# Patient Record
Sex: Female | Born: 1955 | Race: White | Hispanic: No | Marital: Married | Smoking: Never smoker
Health system: Southern US, Community
[De-identification: ages and names within clinical notes are randomized; demographics above are authoritative.]

## PROBLEM LIST (undated history)

## (undated) DIAGNOSIS — Z923 Personal history of irradiation: Secondary | ICD-10-CM

## (undated) DIAGNOSIS — M1990 Unspecified osteoarthritis, unspecified site: Secondary | ICD-10-CM

## (undated) DIAGNOSIS — T8859XA Other complications of anesthesia, initial encounter: Secondary | ICD-10-CM

## (undated) DIAGNOSIS — C801 Malignant (primary) neoplasm, unspecified: Secondary | ICD-10-CM

## (undated) DIAGNOSIS — F32A Depression, unspecified: Secondary | ICD-10-CM

## (undated) DIAGNOSIS — F419 Anxiety disorder, unspecified: Secondary | ICD-10-CM

## (undated) HISTORY — PX: BREAST LUMPECTOMY: SHX2

## (undated) HISTORY — PX: OTHER SURGICAL HISTORY: SHX169

## (undated) HISTORY — PX: ABDOMINAL HYSTERECTOMY: SHX81

---

## 1998-08-10 ENCOUNTER — Ambulatory Visit (HOSPITAL_COMMUNITY): Admission: RE | Admit: 1998-08-10 | Discharge: 1998-08-10 | Payer: Self-pay

## 1999-12-07 ENCOUNTER — Ambulatory Visit (HOSPITAL_COMMUNITY): Admission: RE | Admit: 1999-12-07 | Discharge: 1999-12-07 | Payer: Self-pay

## 2001-01-23 ENCOUNTER — Ambulatory Visit (HOSPITAL_COMMUNITY): Admission: RE | Admit: 2001-01-23 | Discharge: 2001-01-23 | Payer: Self-pay

## 2002-05-07 ENCOUNTER — Ambulatory Visit (HOSPITAL_COMMUNITY): Admission: RE | Admit: 2002-05-07 | Discharge: 2002-05-07 | Payer: Self-pay | Admitting: Obstetrics and Gynecology

## 2002-05-07 ENCOUNTER — Encounter: Payer: Self-pay | Admitting: Obstetrics and Gynecology

## 2002-05-11 ENCOUNTER — Encounter: Payer: Self-pay | Admitting: Obstetrics and Gynecology

## 2002-05-11 ENCOUNTER — Encounter: Admission: RE | Admit: 2002-05-11 | Discharge: 2002-05-11 | Payer: Self-pay | Admitting: Obstetrics and Gynecology

## 2003-06-22 ENCOUNTER — Encounter: Payer: Self-pay | Admitting: Obstetrics and Gynecology

## 2003-06-22 ENCOUNTER — Ambulatory Visit (HOSPITAL_COMMUNITY): Admission: RE | Admit: 2003-06-22 | Discharge: 2003-06-22 | Payer: Self-pay | Admitting: Obstetrics and Gynecology

## 2006-04-16 ENCOUNTER — Encounter: Payer: Self-pay | Admitting: Internal Medicine

## 2007-12-24 ENCOUNTER — Ambulatory Visit: Payer: Self-pay | Admitting: Gynecology

## 2011-10-09 ENCOUNTER — Encounter (HOSPITAL_BASED_OUTPATIENT_CLINIC_OR_DEPARTMENT_OTHER)
Admission: RE | Admit: 2011-10-09 | Discharge: 2011-10-09 | Disposition: A | Discharge status: Home / Self Care | Payer: BC Managed Care – PPO | Source: Ambulatory Visit | Attending: Orthopedic Surgery | Admitting: Orthopedic Surgery

## 2011-10-10 ENCOUNTER — Ambulatory Visit (HOSPITAL_BASED_OUTPATIENT_CLINIC_OR_DEPARTMENT_OTHER)
Admission: RE | Admit: 2011-10-10 | Discharge: 2011-10-10 | Disposition: A | Discharge status: Home / Self Care | Payer: BC Managed Care – PPO | Source: Ambulatory Visit | Attending: Orthopedic Surgery | Admitting: Orthopedic Surgery

## 2011-10-10 DIAGNOSIS — Z01810 Encounter for preprocedural cardiovascular examination: Secondary | ICD-10-CM | POA: Insufficient documentation

## 2011-10-10 DIAGNOSIS — M7660 Achilles tendinitis, unspecified leg: Secondary | ICD-10-CM | POA: Insufficient documentation

## 2011-10-10 DIAGNOSIS — M722 Plantar fascial fibromatosis: Secondary | ICD-10-CM | POA: Insufficient documentation

## 2011-10-10 DIAGNOSIS — Z01812 Encounter for preprocedural laboratory examination: Secondary | ICD-10-CM | POA: Insufficient documentation

## 2011-10-10 LAB — POCT HEMOGLOBIN-HEMACUE: Hemoglobin: 12.7 g/dL (ref 12.0–15.0)

## 2011-10-17 NOTE — Op Note (Signed)
Allison Gilbert, Allison Gilbert NO.:  [id number]  MEDICAL RECORD NO.:  [id number]  LOCATION:                                 FACILITY:  PHYSICIAN:  Loreta Ave, M.D. DATE OF BIRTH:  11-06-1956  DATE OF PROCEDURE:  10/10/2011 DATE OF DISCHARGE:                              OPERATIVE REPORT   PREOPERATIVE DIAGNOSES: 1. Right heel marked tendinosis tendinopathy, interstitial tearing,     Achilles tendon at os calcis attachment.  Pre Achilles bursitis and     Haglund deformity. 2. Plantar fasciitis, left heel.  POSTOPERATIVE DIAGNOSES: 1. Right heel marked tendinosis tendinopathy, interstitial tearing,     Achilles tendon at os calcis attachment.  Pre Achilles bursitis and     Haglund deformity. 2. Plantar fasciitis, left heel.  PROCEDURES:  Right heel: 1. Exploration and debridement and then primary repair, reattachment     of the Achilles tendon to the os calcis with suture, speed bridge     technique.  Two corkscrew anchors, two FiberWire sutures, two push     lock anchors. 2. Removal of Haglund deformity and pre Achilles bursitis.  Left heel: 1. Injection of plantar fascia attachment with Depo-Medrol and     Marcaine.  SURGEON:  Loreta Ave, MD.  ASSISTANT:  Genene Churn. Barry Dienes, [place], present throughout the entire case and necessary for timely completion of procedure.  ANESTHESIA:  General.  BLOOD LOSS:  Minimal.  SPECIMENS:  None.  CULTURES:  None.  COMPLICATION:  None.  DRESSINGS:  Sterile compressive with a splint on the right.  TOURNIQUET TIME:  On the right, 1 hour.  PROCEDURE IN DETAIL:  The patient was brought to the operating room and placed in the operating room table in supine position.  After adequate anesthesia had been obtained, the patient was turned in prone position. Appropriate padding and support, and then tourniquet applied on the right thigh.  Attention was first turned on the left.  Plantar fascia attachment to the os  calcis was injected through a plantar approach with Depo-Medrol and Marcaine under sterile technique.  Attention was turned on the right.  Prepped and draped in usual sterile fashion. Exsanguinated with elevation of Esmarch.  Tourniquet inflated to 300 mmHg.  A longitudinal incision along the lateral border of the Achilles down on the lateral side of the os calcis.  Skin and subcutaneous tissues divided.  Marked thickening inflammatory debris superficially Achilles debrided.  Achilles opened longitudinally and the entire distal attachment had calcification thickening, tendinosis, tendinopathy, partial tearing throughout.  This was split longitudinally from the distal and proximal and then taken down about 90% off the os calcis attachment in order to debride all abnormal tissue.  Through that interval, pre Achilles bursa was drained and excised.  Haglund deformity was contoured with osteotomes and rongeurs.  Fluoroscopic guidance views to confirm adequate contouring.  Wound irrigated.  I then put in 2 anchors with FiberWire suture at the proximal attachment to the os calcis.  Sutures from them were then used 2 weave up and down the Achilles to firmly close the longitudinal opening, capture the tendon, and bring it down distally.  Sutured in place.  Those  2 were then brought out through the tendon, crossed over each other, and anchored distally through two push lock anchors.  Location of the anchors confirmed with fluoroscopy as well as visually.  At completion, nice firm reattachment.  I could complete the extender knee as well as dorsiflex her ankle without too much tension on the repair.  Wound irrigated.  Closed with Vicryl and staples.  Sterile compressive dressing applied.  Short-leg splint applied.  Tourniquet deflated and removed.  Anesthesia reversed.  Brought to recovery room.  Tolerated the surgery well.  No complications.     Loreta Ave, M.D.     DFM/MEDQ  D:   10/10/2011  T:  10/10/2011  Job:  [id number]  Electronically Signed by Mckinley Jewel M.D. on 10/17/2011 11:28:54 AM

## 2014-08-11 ENCOUNTER — Other Ambulatory Visit: Payer: Self-pay | Admitting: Orthopedic Surgery

## 2014-08-11 DIAGNOSIS — M542 Cervicalgia: Secondary | ICD-10-CM

## 2014-08-14 ENCOUNTER — Ambulatory Visit
Admission: RE | Admit: 2014-08-14 | Discharge: 2014-08-14 | Disposition: A | Discharge status: Home / Self Care | Payer: BC Managed Care – PPO | Source: Ambulatory Visit | Attending: Orthopedic Surgery | Admitting: Orthopedic Surgery

## 2014-08-14 DIAGNOSIS — M542 Cervicalgia: Secondary | ICD-10-CM

## 2014-09-25 DIAGNOSIS — F4323 Adjustment disorder with mixed anxiety and depressed mood: Secondary | ICD-10-CM | POA: Insufficient documentation

## 2014-09-25 DIAGNOSIS — G4700 Insomnia, unspecified: Secondary | ICD-10-CM | POA: Insufficient documentation

## 2015-10-23 ENCOUNTER — Other Ambulatory Visit: Payer: Self-pay | Admitting: Orthopedic Surgery

## 2015-10-23 DIAGNOSIS — M4722 Other spondylosis with radiculopathy, cervical region: Secondary | ICD-10-CM

## 2015-11-05 ENCOUNTER — Ambulatory Visit
Admission: RE | Admit: 2015-11-05 | Discharge: 2015-11-05 | Disposition: A | Discharge status: Home / Self Care | Payer: BLUE CROSS/BLUE SHIELD | Source: Ambulatory Visit | Attending: Orthopedic Surgery | Admitting: Orthopedic Surgery

## 2015-11-05 DIAGNOSIS — M4722 Other spondylosis with radiculopathy, cervical region: Secondary | ICD-10-CM

## 2016-11-04 ENCOUNTER — Other Ambulatory Visit: Payer: Self-pay | Admitting: Orthopedic Surgery

## 2016-11-04 DIAGNOSIS — M4316 Spondylolisthesis, lumbar region: Secondary | ICD-10-CM

## 2016-11-19 ENCOUNTER — Other Ambulatory Visit: Payer: BLUE CROSS/BLUE SHIELD

## 2016-12-07 ENCOUNTER — Ambulatory Visit
Admission: RE | Admit: 2016-12-07 | Discharge: 2016-12-07 | Disposition: A | Discharge status: Home / Self Care | Payer: BLUE CROSS/BLUE SHIELD | Source: Ambulatory Visit | Attending: Orthopedic Surgery | Admitting: Orthopedic Surgery

## 2016-12-07 DIAGNOSIS — M4316 Spondylolisthesis, lumbar region: Secondary | ICD-10-CM

## 2017-06-28 ENCOUNTER — Emergency Department (HOSPITAL_BASED_OUTPATIENT_CLINIC_OR_DEPARTMENT_OTHER)
Admission: EM | Admit: 2017-06-28 | Discharge: 2017-06-28 | Disposition: A | Discharge status: Home / Self Care | Payer: BLUE CROSS/BLUE SHIELD | Attending: Emergency Medicine | Admitting: Emergency Medicine

## 2017-06-28 ENCOUNTER — Encounter (HOSPITAL_BASED_OUTPATIENT_CLINIC_OR_DEPARTMENT_OTHER): Payer: Self-pay | Admitting: Emergency Medicine

## 2017-06-28 DIAGNOSIS — Z79899 Other long term (current) drug therapy: Secondary | ICD-10-CM | POA: Insufficient documentation

## 2017-06-28 DIAGNOSIS — R531 Weakness: Secondary | ICD-10-CM | POA: Diagnosis not present

## 2017-06-28 LAB — BASIC METABOLIC PANEL
Anion gap: 9 (ref 5–15)
BUN: 16 mg/dL (ref 6–20)
CO2: 28 mmol/L (ref 22–32)
CREATININE: 0.65 mg/dL (ref 0.44–1.00)
Calcium: 9.1 mg/dL (ref 8.9–10.3)
Chloride: 102 mmol/L (ref 101–111)
GFR calc Af Amer: >60 mL/min (ref >60)
GFR calc non Af Amer: >60 mL/min (ref >60)
GLUCOSE: 99 mg/dL (ref 65–99)
Potassium: 3.7 mmol/L (ref 3.5–5.1)
SODIUM: 139 mmol/L (ref 135–145)

## 2017-06-28 LAB — CBC WITH DIFFERENTIAL/PLATELET
Basophils Absolute: 0 10*3/uL (ref 0.0–0.1)
Basophils Relative: 1 %
EOS ABS: 0.2 10*3/uL (ref 0.0–0.7)
EOS PCT: 4 %
HCT: 38.4 % (ref 36.0–46.0)
Hemoglobin: 12.8 g/dL (ref 12.0–15.0)
LYMPHS ABS: 2.1 10*3/uL (ref 0.7–4.0)
LYMPHS PCT: 38 %
MCH: 32.7 pg (ref 26.0–34.0)
MCHC: 33.3 g/dL (ref 30.0–36.0)
MCV: 98 fL (ref 78.0–100.0)
Monocytes Absolute: 0.4 10*3/uL (ref 0.1–1.0)
Monocytes Relative: 7 %
Neutro Abs: 2.9 10*3/uL (ref 1.7–7.7)
Neutrophils Relative %: 50 %
Platelets: 166 10*3/uL (ref 150–400)
RBC: 3.92 MIL/uL (ref 3.87–5.11)
RDW: 12.9 % (ref 11.5–15.5)
WBC: 5.7 10*3/uL (ref 4.0–10.5)

## 2017-06-28 LAB — URINALYSIS, ROUTINE W REFLEX MICROSCOPIC
BILIRUBIN URINE: NEGATIVE
GLUCOSE, UA: NEGATIVE mg/dL
Ketones, ur: NEGATIVE mg/dL
Leukocytes, UA: NEGATIVE
Nitrite: NEGATIVE
PROTEIN: NEGATIVE mg/dL
Specific Gravity, Urine: 1.005 (ref 1.005–1.030)
pH: 7 (ref 5.0–8.0)

## 2017-06-28 LAB — URINALYSIS, MICROSCOPIC (REFLEX)

## 2017-06-28 LAB — TROPONIN I: Troponin I: <0.03 ng/mL (ref <0.03)

## 2017-06-28 MED ORDER — SODIUM CHLORIDE 0.9 % IV BOLUS (SEPSIS)
1000.0000 mL | Freq: Once | INTRAVENOUS | Status: AC
  Administered 2017-06-28: 1000 mL via INTRAVENOUS

## 2017-06-28 MED ORDER — LORAZEPAM 1 MG PO TABS: 1.0000 mg | ORAL_TABLET | Freq: Four times a day (QID) | ORAL | 0 refills | Status: DC | PRN

## 2017-06-28 NOTE — ED Notes (Signed)
ED Provider at bedside. 

## 2017-06-28 NOTE — Discharge Instructions (Signed)
Drink plenty of fluids and get plenty of rest.  Follow-up with your primary Dr. if you're not improving in the next 3-4 days, and return to the ER if symptoms significantly worsen or change.

## 2017-06-28 NOTE — ED Triage Notes (Signed)
Pt presents to ED with complaints of weakness, tightness in her chest,  Nausea, shortness of breath and states she feels weird a weird sensation behind her left eye and the back of her head and tightness in her throat. Marland Kitchen

## 2017-06-28 NOTE — ED Provider Notes (Signed)
[facility] Provider Note   CSN: [id number] Arrival date & time: 06/28/17  1932     History   Chief Complaint Chief Complaint  Patient presents with  . Weakness    HPI Allison Gilbert is a 61 y.o. female.  Patient is a 61 year old female with past medical history of neck surgery presenting with complaints of weakness, headache, chest discomfort, and generalized malaise. This is been ongoing for the past 2 days, then became worse this evening. She denies any difficulty breathing. She denies any fevers, chills, or productive cough. She does report increased stress at work.   The history is provided by the patient.  Weakness  Primary symptoms include no focal weakness. This is a new problem. The current episode started 2 days ago. The problem has been gradually worsening. There has been no fever. Associated symptoms include chest pain. Pertinent negatives include no shortness of breath.    History reviewed. No pertinent past medical history.  There are no active problems to display for this patient.   Past Surgical History:  Procedure Laterality Date  . ABDOMINAL HYSTERECTOMY    . cage and plates in neck    . plate in heel      OB History    No data available       Home Medications    Prior to Admission medications   Medication Sig Start Date End Date Taking? Authorizing Provider  gabapentin (NEURONTIN) 300 MG capsule Take 300 mg by mouth as needed.   Yes [provider]    Family History No family history on file.  Social History Social History  Substance Use Topics  . Smoking status: Not on file  . Smokeless tobacco: Not on file  . Alcohol use Not on file     Allergies   Patient has no known allergies.   Review of Systems Review of Systems  Respiratory: Negative for shortness of breath.   Cardiovascular: Positive for chest pain.  Neurological: Positive for weakness. Negative for focal weakness.  All other systems reviewed  and are negative.    Physical Exam Updated Vital Signs BP 119/70   Pulse 81   Temp 97.9 F (36.6 C) (Oral)   Resp 13   Ht 5' 6.75" (1.695 m)   Wt 65.8 kg (145 lb)   SpO2 100%   BMI 22.88 kg/m   Physical Exam  Constitutional: She is oriented to person, place, and time. She appears well-developed and well-nourished. No distress.  HENT:  Head: Normocephalic and atraumatic.  Eyes: Pupils are equal, round, and reactive to light. EOM are normal.  Neck: Normal range of motion. Neck supple.  Cardiovascular: Normal rate and regular rhythm.  Exam reveals no gallop and no friction rub.   No murmur heard. Pulmonary/Chest: Effort normal and breath sounds normal. No respiratory distress. She has no wheezes.  Abdominal: Soft. Bowel sounds are normal. She exhibits no distension. There is no tenderness.  Musculoskeletal: Normal range of motion. She exhibits no edema.  Neurological: She is alert and oriented to person, place, and time. No cranial nerve deficit. She exhibits normal muscle tone. Coordination normal.  Skin: Skin is warm and dry. She is not diaphoretic.  Nursing note and vitals reviewed.    ED Treatments / Results  Labs (all labs ordered are listed, but only abnormal results are displayed) Labs Reviewed  URINALYSIS, ROUTINE W REFLEX MICROSCOPIC  BASIC METABOLIC PANEL  CBC WITH DIFFERENTIAL/PLATELET  TROPONIN I    EKG  EKG  Interpretation  Date/Time:  Saturday June 28 2017 19:42:13 EDT Ventricular Rate:  88 PR Interval:    QRS Duration: 107 QT Interval:  385 QTC Calculation: 466 R Axis:   68 Text Interpretation:  Sinus rhythm Borderline T abnormalities, anterior leads Confirmed by Veryl Speak [phone]) on 06/28/2017 9:07:06 PM       Radiology No results found.  Procedures Procedures (including critical care time)  Medications Ordered in ED Medications  sodium chloride 0.9 % bolus 1,000 mL (not administered)     Initial Impression / Assessment and Plan /  ED Course  I have reviewed the triage vital signs and the nursing notes.  Pertinent labs & imaging results that were available during my care of the patient were reviewed by me and considered in my medical decision making (see chart for details).  Patient's workup reveals no laboratory abnormality. EKG is unremarkable. She is not tachycardic, there is no hypoxia, and she otherwise appears well. She does report increased stressors with work. I found nothing here today that appears emergent. I see no indication for admission or other workup at this time. She will be discharged, to return as needed for any problems.  Final Clinical Impressions(s) / ED Diagnoses   Final diagnoses:  None    New Prescriptions New Prescriptions   No medications on file     Veryl Speak, MD 06/28/17 2235

## 2017-06-28 NOTE — ED Notes (Signed)
Patient stated that she feel like fullness to the back of her head and to the back of her left eye.  She also felt weakness to both of her arms but more so on her left arm and tightness to her chest, right side of her chest.

## 2017-10-23 DIAGNOSIS — K219 Gastro-esophageal reflux disease without esophagitis: Secondary | ICD-10-CM | POA: Insufficient documentation

## 2018-11-18 DIAGNOSIS — Z90710 Acquired absence of both cervix and uterus: Secondary | ICD-10-CM | POA: Insufficient documentation

## 2018-11-18 DIAGNOSIS — Z9889 Other specified postprocedural states: Secondary | ICD-10-CM | POA: Insufficient documentation

## 2018-11-18 DIAGNOSIS — Z9079 Acquired absence of other genital organ(s): Secondary | ICD-10-CM | POA: Insufficient documentation

## 2019-05-03 ENCOUNTER — Ambulatory Visit (INDEPENDENT_AMBULATORY_CARE_PROVIDER_SITE_OTHER): Payer: Self-pay | Admitting: Licensed Clinical Social Worker

## 2019-05-03 ENCOUNTER — Encounter (HOSPITAL_COMMUNITY): Payer: Self-pay | Admitting: Licensed Clinical Social Worker

## 2019-05-03 ENCOUNTER — Other Ambulatory Visit: Payer: Self-pay

## 2019-05-03 DIAGNOSIS — F4323 Adjustment disorder with mixed anxiety and depressed mood: Secondary | ICD-10-CM

## 2019-05-03 NOTE — Progress Notes (Signed)
  Mindi Curling, LCSW

## 2019-05-03 NOTE — Progress Notes (Signed)
Virtual Visit via Video Note  I connected with Allison Gilbert on 05/03/19 at  3:30 PM EDT by a video enabled telemedicine application and verified that I am speaking with the correct person using two identifiers.  I discussed the limitations of evaluation and management by telemedicine and the availability of in person appointments. The patient expressed understanding and agreed to proceed.  I discussed the assessment and treatment plan with the patient. The patient was provided an opportunity to ask questions and all were answered. The patient agreed with the plan and demonstrated an understanding of the instructions.   The patient was advised to call back or seek an in-person evaluation if the symptoms worsen or if the condition fails to improve as anticipated.  I provided 55 minutes of non-face-to-face time during this encounter.    Comprehensive Clinical Assessment (CCA) Note  05/03/2019 Allison Gilbert [id number]  Visit Diagnosis:      ICD-10-CM   1. Adjustment disorder with mixed anxiety and depressed mood F43.23       CCA Part One  Part One has been completed on paper by the patient.  (See scanned document in Chart Review)  CCA Part Two A  Intake/Chief Complaint:  CCA Intake With Chief Complaint CCA Part Two Date: 05/03/19 CCA Part Two Time: 1530 Chief Complaint/Presenting Problem: problems with marriage, not getting along with husband. He may want a divorce Patients Currently Reported Symptoms/Problems: anxiety, depression, anger toward husband, frustration, fed up Collateral Involvement: son, some friends at work Individual's Strengths: intelligent, Scientist, research (physical sciences), determined, good sense of humor Individual's Preferences: watching tv, spending time with friends and son, laughing Individual's Abilities: hard working Type of Services Patient Feels Are Needed: OPT- marital counseling services  Mental Health Symptoms Depression:  Depression: Hopelessness, Irritability,  Tearfulness  Mania:  Mania: N/A  Anxiety:   Anxiety: Worrying, Irritability  Psychosis:  Psychosis: N/A  Trauma:  Trauma: N/A  Obsessions:  Obsessions: N/A  Compulsions:  Compulsions: N/A  Inattention:  Inattention: N/A  Hyperactivity/Impulsivity:  Hyperactivity/Impulsivity: N/A  Oppositional/Defiant Behaviors:  Oppositional/Defiant Behaviors: N/A  Borderline Personality:  Emotional Irregularity: N/A  Other Mood/Personality Symptoms:      Mental Status Exam Appearance and self-care  Stature:  Stature: Small  Weight:  Weight: Thin  Clothing:  Clothing: Neat/clean  Grooming:  Grooming: Well-groomed  Cosmetic use:  Cosmetic Use: Age appropriate  Posture/gait:  Posture/Gait: Tense  Motor activity:  Motor Activity: Not Remarkable  Sensorium  Attention:  Attention: Normal  Concentration:  Concentration: Normal  Orientation:  Orientation: X5  Recall/memory:  Recall/Memory: Normal  Affect and Mood  Affect:  Affect: Depressed  Mood:  Mood: Irritable  Relating  Eye contact:  Eye Contact: Normal  Facial expression:  Facial Expression: Sad  Attitude toward examiner:  Attitude Toward Examiner: Cooperative  Thought and Language  Speech flow: Speech Flow: Normal  Thought content:  Thought Content: Appropriate to mood and circumstances  Preoccupation:   NA  Hallucinations:   NA  Organization:   NA  Transport planner of Knowledge:  Fund of Knowledge: Average  Intelligence:  Intelligence: Average  Abstraction:  Abstraction: Normal  Judgement:  Judgement: Normal  Reality Testing:  Reality Testing: Realistic  Insight:  Insight: Good  Decision Making:  Decision Making: Normal  Social Functioning  Social Maturity:  Social Maturity: Responsible  Social Judgement:  Social Judgement: Normal  Stress  Stressors:  Stressors: Family conflict  Coping Ability:  Coping Ability: Deficient supports, English as a second language teacher Deficits:  NA  Supports:   son, friends   Family and Psychosocial  History: Family history Marital status: Married Number of Years Married: 5 What types of issues is patient dealing with in the relationship?: husband is very jealous and controlling, gets angry often, husband is jealous of the way she cares for her autistic son Additional relationship information: had been married to son's father for many years and then after they divorced, she was single for a number of years. Husband has been married 5 times.  Are you sexually active?: No What is your sexual orientation?: heterosexual Has your sexual activity been affected by drugs, alcohol, medication, or emotional stress?: emotional stress Does patient have children?: Yes How many children?: 1 How is patient's relationship with their children?: Cristie Hem is in his 52s and has autism. He is relatively high functioning, is going to college, and lives with Jerrye Beavers and husband.   Childhood History:     CCA Part Two B  Employment/Work Situation: Employment / Work Situation Employment situation: Employed Where is patient currently employed?: [employer]  How long has patient been employed?: 3 years Patient's job has been impacted by current illness: No Did You Receive Any Psychiatric Treatment/Services While in Passenger transport manager?: No Are There Guns or Other Weapons in [place]?: Yes Types of Guns/Weapons: husband has rifle and Careers information officer?: Yes  Education: Education Did Teacher, adult education From [employer]?: Yes Did Physicist, medical?: Yes Did Heritage manager?: No Did You Have An Individualized Education Program (IIEP): No Did You Have Any Difficulty At [employer]?: No  Religion: Religion/Spirituality Are You A Religious Person?: Yes What is Your Religious Affiliation?: Baptist How Might This Affect Treatment?: it won't  Leisure/Recreation: Leisure / Recreation Leisure and Hobbies: watching tv, spending time with son, going out with  girlfriends  Exercise/Diet: Exercise/Diet Do You Exercise?: No Have You Gained or Lost A Significant Amount of Weight in the Past Six Months?: No Do You Follow a Special Diet?: No Do You Have Any Trouble Sleeping?: No  CCA Part Two C  Alcohol/Drug Use: Alcohol / Drug Use Pain Medications: see MAR Prescriptions: see MAR Over the Counter: see MAR History of alcohol / drug use?: No history of alcohol / drug abuse                      CCA Part Three  ASAM's:  Six Dimensions of Multidimensional Assessment  Dimension 1:  Acute Intoxication and/or Withdrawal Potential:     Dimension 2:  Biomedical Conditions and Complications:     Dimension 3:  Emotional, Behavioral, or Cognitive Conditions and Complications:     Dimension 4:  Readiness to Change:     Dimension 5:  Relapse, Continued use, or Continued Problem Potential:     Dimension 6:  Recovery/Living Environment:      Substance use Disorder (SUD)    Social Function:  Social Functioning Social Maturity: Responsible Social Judgement: Normal  Stress:  Stress Stressors: Family conflict Coping Ability: Deficient supports, Overwhelmed Patient Takes Medications The Way The Doctor Instructed?: Yes Priority Risk: Low Acuity  Risk Assessment- Self-Harm Potential: Risk Assessment For Self-Harm Potential Thoughts of Self-Harm: No current thoughts Method: No plan Availability of Means: No access/NA  Risk Assessment -Dangerous to Others Potential: Risk Assessment For Dangerous to Others Potential Method: No Plan Availability of Means: No access or NA Intent: Vague intent or NA Notification Required: No need or identified person  DSM5 Diagnoses: There are no  active problems to display for this patient.   Patient Centered Plan: Patient is on the following Treatment Plan(s):  Anxiety and Depression  Recommendations for Services/Supports/Treatments: Recommendations for Services/Supports/Treatments Recommendations  For Services/Supports/Treatments: Individual Therapy  Treatment Plan Summary: OP Treatment Plan Summary: "to improve my relationship with my husband or to get out of the marriage".  Referrals to Alternative Service(s): Referred to Alternative Service(s):   Place:   Date:   Time:    Referred to Alternative Service(s):   Place:   Date:   Time:    Referred to Alternative Service(s):   Place:   Date:   Time:    Referred to Alternative Service(s):   Place:   Date:   Time:     Mindi Curling , LCSW

## 2019-05-12 ENCOUNTER — Encounter: Payer: Self-pay | Admitting: Family

## 2019-11-08 ENCOUNTER — Ambulatory Visit (INDEPENDENT_AMBULATORY_CARE_PROVIDER_SITE_OTHER): Payer: PRIVATE HEALTH INSURANCE | Admitting: Family

## 2019-11-08 ENCOUNTER — Other Ambulatory Visit: Payer: Self-pay

## 2019-11-08 ENCOUNTER — Encounter: Payer: Self-pay | Admitting: Family

## 2019-11-08 VITALS — BP 114/76 | HR 80 | Temp 98.1°F | Ht 66.75 in | Wt 152.1 lb

## 2019-11-08 DIAGNOSIS — F419 Anxiety disorder, unspecified: Secondary | ICD-10-CM | POA: Diagnosis not present

## 2019-11-08 DIAGNOSIS — G4700 Insomnia, unspecified: Secondary | ICD-10-CM | POA: Diagnosis not present

## 2019-11-08 MED ORDER — LORAZEPAM 1 MG PO TABS: 1.0000 mg | ORAL_TABLET | Freq: Every evening | ORAL | 0 refills | Status: DC | PRN

## 2019-11-08 MED ORDER — CITALOPRAM HYDROBROMIDE 20 MG PO TABS: 20.0000 mg | ORAL_TABLET | Freq: Every day | ORAL | 3 refills | Status: DC

## 2019-11-08 NOTE — Patient Instructions (Signed)
Please check on the status of your yearly labs- let me know if they need to be updated; Please get your mammogram in December as we discussed.

## 2019-11-08 NOTE — Progress Notes (Signed)
  Allison Gilbert is a 63 y.o. female with the following history as recorded in EpicCare:  There are no active problems to display for this patient.   Current Outpatient Medications  Medication Sig Dispense Refill  . Calcium Carbonate-Vitamin D (CALCIUM 500 + D) 500-125 MG-UNIT TABS Take by mouth.    . citalopram (CELEXA) 20 MG tablet Take 1 tablet (20 mg total) by mouth daily. 90 tablet 3  . estradiol (ESTRACE) 0.5 MG tablet Take by mouth.    Marland Kitchen LORazepam (ATIVAN) 1 MG tablet Take 1 tablet (1 mg total) by mouth at bedtime as needed for anxiety. 30 tablet 0   No current facility-administered medications for this visit.     Allergies: Patient has no known allergies.  No past medical history on file.  Past Surgical History:  Procedure Laterality Date  . ABDOMINAL HYSTERECTOMY    . cage and plates in neck    . plate in heel      No family history on file.  Social History   Tobacco Use  . Smoking status: Not on file  Substance Use Topics  . Alcohol use: Not on file    Subjective:  Presents today as a new patient; in baseline state of health today; Has had CPE with GYN this year- will be having mammogram in December 2020; patient planning to schedule herself; in the process of tapering dosage of Estrogen;  Colonoscopy completed in 07/2019; Would like to get flu shot today; Under care of Raliegh Ip for orthopedic needs;    Objective:  Vitals:   11/08/19 0902  BP: 114/76  Pulse: 80  Temp: 98.1 F (36.7 C)  TempSrc: Oral  SpO2: 98%  Weight: 152 lb 1.9 oz (69 kg)  Height: 5' 6.75" (1.695 m)    General: Well developed, well nourished, in no acute distress  Skin : Warm and dry.  Head: Normocephalic and atraumatic  Lungs: Respirations unlabored; clear to auscultation bilaterally without wheeze, rales, rhonchi  CVS exam: normal rate and regular rhythm.  Neurologic: Alert and oriented; speech intact; face symmetrical; moves all extremities well; CNII-XII intact without focal  deficit   Assessment:  1. Anxiety   2. Insomnia, unspecified type     Plan:  Refills updated as requested; patient will verify if she needs yearly labs- call back if labs need to be scheduled; She will schedule her mammogram for December 2020; Flu shot given;   This visit occurred during the SARS-CoV-2 public health emergency.  Safety protocols were in place, including screening questions prior to the visit, additional usage of staff PPE, and extensive cleaning of exam room while observing appropriate contact time as indicated for disinfecting solutions.     No follow-ups on file.  No orders of the defined types were placed in this encounter.   Requested Prescriptions   Signed Prescriptions Disp Refills  . citalopram (CELEXA) 20 MG tablet 90 tablet 3    Sig: Take 1 tablet (20 mg total) by mouth daily.  Marland Kitchen LORazepam (ATIVAN) 1 MG tablet 30 tablet 0    Sig: Take 1 tablet (1 mg total) by mouth at bedtime as needed for anxiety.

## 2020-05-31 DIAGNOSIS — L738 Other specified follicular disorders: Secondary | ICD-10-CM | POA: Diagnosis not present

## 2020-05-31 DIAGNOSIS — L821 Other seborrheic keratosis: Secondary | ICD-10-CM | POA: Diagnosis not present

## 2020-05-31 DIAGNOSIS — Z85828 Personal history of other malignant neoplasm of skin: Secondary | ICD-10-CM | POA: Diagnosis not present

## 2020-05-31 DIAGNOSIS — D692 Other nonthrombocytopenic purpura: Secondary | ICD-10-CM | POA: Diagnosis not present

## 2020-05-31 DIAGNOSIS — L820 Inflamed seborrheic keratosis: Secondary | ICD-10-CM | POA: Diagnosis not present

## 2020-06-12 ENCOUNTER — Ambulatory Visit (INDEPENDENT_AMBULATORY_CARE_PROVIDER_SITE_OTHER): Payer: Medicare Other | Admitting: Licensed Clinical Social Worker

## 2020-06-12 ENCOUNTER — Encounter (HOSPITAL_COMMUNITY): Payer: Self-pay | Admitting: Licensed Clinical Social Worker

## 2020-06-12 ENCOUNTER — Other Ambulatory Visit: Payer: Self-pay

## 2020-06-12 DIAGNOSIS — F4323 Adjustment disorder with mixed anxiety and depressed mood: Secondary | ICD-10-CM | POA: Diagnosis not present

## 2020-06-12 NOTE — Progress Notes (Signed)
Virtual Visit via Telephone Note  I connected with Allison Gilbert on 06/12/20 at  1:30 PM EDT by telephone and verified that I am speaking with the correct person using two identifiers.  Location: Patient: home Provider: office   I discussed the limitations, risks, security and privacy concerns of performing an evaluation and management service by telephone and the availability of in person appointments. I also discussed with the patient that there may be a patient responsible charge related to this service. The patient expressed understanding and agreed to proceed.   Type of treatment: Individual therapy   Treatment Goals addressed: "Improve my relationship with my son's father and step mother".   Interventions: CBT   Summary: Allison Gilbert is a 64 y.o. female who presents with Adjustment Disorder with mixed anxiety and depressed mood.   Suicidal/Homicidal: No - without intent/plan   Therapist Response: Allison Gilbert met with clinician for an individual session. Allison Gilbert discussed her psychiatric symptoms, her current life events and her homework. Allison Gilbert shared that things have really improved with her husband over the past year, particularly because he "knew where I was 100% of the time". Clinician discussed her feelings about the relationship, as well as current problems with her son's family. Clinician utilized CBT to process thoughts, feelings, and behaviors with her son's step mother. Clinician processed feelings of anger and anxiety surrounding those interactions. Clinician explored urge to maintain contact with these people, due to her son being 9 years old. Clinician utilized CBT Pros and Cons of stepping back and allowing son to be the main communicator with them. Clinician also identified the values she places on them, and herself, which can be challenging for Allison Gilbert to not judge them.   Plan: 4 weeks   Diagnosis: Axis I: Adjustment Disorder with mixed anxiety and depressed mood  I discussed  the assessment and treatment plan with the patient. The patient was provided an opportunity to ask questions and all were answered. The patient agreed with the plan and demonstrated an understanding of the instructions.   The patient was advised to call back or seek an in-person evaluation if the symptoms worsen or if the condition fails to improve as anticipated.  I provided 60 minutes of non-face-to-face time during this encounter.   Mindi Curling, LCSW

## 2020-07-10 ENCOUNTER — Ambulatory Visit (INDEPENDENT_AMBULATORY_CARE_PROVIDER_SITE_OTHER): Payer: Medicare Other | Admitting: Family

## 2020-07-10 ENCOUNTER — Encounter: Payer: Self-pay | Admitting: Family

## 2020-07-10 ENCOUNTER — Other Ambulatory Visit: Payer: Self-pay

## 2020-07-10 VITALS — BP 110/78 | HR 73 | Temp 97.9°F | Ht 66.5 in | Wt 151.2 lb

## 2020-07-10 DIAGNOSIS — Z0000 Encounter for general adult medical examination without abnormal findings: Secondary | ICD-10-CM | POA: Diagnosis not present

## 2020-07-10 DIAGNOSIS — E559 Vitamin D deficiency, unspecified: Secondary | ICD-10-CM

## 2020-07-10 DIAGNOSIS — Z01419 Encounter for gynecological examination (general) (routine) without abnormal findings: Secondary | ICD-10-CM

## 2020-07-10 DIAGNOSIS — Z23 Encounter for immunization: Secondary | ICD-10-CM

## 2020-07-10 DIAGNOSIS — Z13220 Encounter for screening for lipoid disorders: Secondary | ICD-10-CM

## 2020-07-10 DIAGNOSIS — Z1231 Encounter for screening mammogram for malignant neoplasm of breast: Secondary | ICD-10-CM

## 2020-07-10 DIAGNOSIS — E539 Vitamin B deficiency, unspecified: Secondary | ICD-10-CM | POA: Diagnosis not present

## 2020-07-10 LAB — COMPREHENSIVE METABOLIC PANEL
AG Ratio: 1.9 (calc) (ref 1.0–2.5)
ALT: 13 U/L (ref 6–29)
AST: 19 U/L (ref 10–35)
Albumin: 4.6 g/dL (ref 3.6–5.1)
Alkaline phosphatase (APISO): 48 U/L (ref 37–153)
BUN: 13 mg/dL (ref 7–25)
CO2: 29 mmol/L (ref 20–32)
Calcium: 9.5 mg/dL (ref 8.6–10.4)
Chloride: 103 mmol/L (ref 98–110)
Creat: 0.76 mg/dL (ref 0.50–0.99)
Globulin: 2.4 g/dL (calc) (ref 1.9–3.7)
Glucose, Bld: 86 mg/dL (ref 65–99)
Potassium: 4.2 mmol/L (ref 3.5–5.3)
Sodium: 140 mmol/L (ref 135–146)
Total Bilirubin: 1.3 mg/dL — ABNORMAL HIGH (ref 0.2–1.2)
Total Protein: 7 g/dL (ref 6.1–8.1)

## 2020-07-10 LAB — CBC WITH DIFFERENTIAL/PLATELET
Absolute Monocytes: 331 cells/uL (ref 200–950)
Basophils Absolute: 62 cells/uL (ref 0–200)
Basophils Relative: 1.3 %
Eosinophils Absolute: 163 cells/uL (ref 15–500)
Eosinophils Relative: 3.4 %
HCT: 42.1 % (ref 35.0–45.0)
Hemoglobin: 13.9 g/dL (ref 11.7–15.5)
Lymphs Abs: 1570 cells/uL (ref 850–3900)
MCH: 31.8 pg (ref 27.0–33.0)
MCHC: 33 g/dL (ref 32.0–36.0)
MCV: 96.3 fL (ref 80.0–100.0)
MPV: 10.8 fL (ref 7.5–12.5)
Monocytes Relative: 6.9 %
Neutro Abs: 2674 cells/uL (ref 1500–7800)
Neutrophils Relative %: 55.7 %
Platelets: 176 10*3/uL (ref 140–400)
RBC: 4.37 10*6/uL (ref 3.80–5.10)
RDW: 12.1 % (ref 11.0–15.0)
Total Lymphocyte: 32.7 %
WBC: 4.8 10*3/uL (ref 3.8–10.8)

## 2020-07-10 LAB — VITAMIN D 25 HYDROXY (VIT D DEFICIENCY, FRACTURES): Vit D, 25-Hydroxy: 26 ng/mL — ABNORMAL LOW (ref 30–100)

## 2020-07-10 LAB — LIPID PANEL
Cholesterol: 234 mg/dL — ABNORMAL HIGH (ref <200)
HDL: 101 mg/dL (ref >=50)
LDL Cholesterol (Calc): 117 mg/dL (calc) — ABNORMAL HIGH
Non-HDL Cholesterol (Calc): 133 mg/dL (calc) — ABNORMAL HIGH (ref <130)
Total CHOL/HDL Ratio: 2.3 (calc) (ref <5.0)
Triglycerides: 64 mg/dL (ref <150)

## 2020-07-10 LAB — TSH: TSH: 3.66 mIU/L (ref 0.40–4.50)

## 2020-07-10 MED ORDER — LORAZEPAM 1 MG PO TABS: 1.0000 mg | ORAL_TABLET | Freq: Every evening | ORAL | 0 refills | Status: DC | PRN

## 2020-07-10 MED ORDER — ESTRADIOL 0.5 MG PO TABS: 0.7500 mg | ORAL_TABLET | Freq: Every day | ORAL | 1 refills | Status: DC

## 2020-07-10 MED ORDER — CITALOPRAM HYDROBROMIDE 20 MG PO TABS: 20.0000 mg | ORAL_TABLET | Freq: Every day | ORAL | 3 refills | Status: DC

## 2020-07-10 NOTE — Progress Notes (Signed)
Allison Gilbert is a 64 y.o. female with the following history as recorded in EpicCare:  Patient Active Problem List   Diagnosis Date Noted  . H/O cervical spine surgery 11/18/2018  . S/P total hysterectomy and bilateral salpingo-oophorectomy 11/18/2018  . GERD (gastroesophageal reflux disease) 10/23/2017  . Adjustment disorder with mixed anxiety and depressed mood 09/25/2014  . Insomnia 09/25/2014    Current Outpatient Medications  Medication Sig Dispense Refill  . Calcium Carbonate-Vitamin D (CALCIUM 500 + D) 500-125 MG-UNIT TABS Take by mouth.    . citalopram (CELEXA) 20 MG tablet Take 1 tablet (20 mg total) by mouth daily. 90 tablet 3  . estradiol (ESTRACE) 0.5 MG tablet Take 1.5 tablets (0.75 mg total) by mouth daily. 135 tablet 1  . LORazepam (ATIVAN) 1 MG tablet Take 1 tablet (1 mg total) by mouth at bedtime as needed for anxiety. 30 tablet 0   No current facility-administered medications for this visit.    Allergies: Codeine  No past medical history on file.  Past Surgical History:  Procedure Laterality Date  . ABDOMINAL HYSTERECTOMY    . cage and plates in neck    . plate in heel      No family history on file.  Social History   Tobacco Use  . Smoking status: Never Smoker  . Smokeless tobacco: Never Used  Substance Use Topics  . Alcohol use: Not on file    Subjective:  Presents for yearly CPE; requesting referral to new GYN in [place]; needs updated order for screening mammogram; Agrees to Prevnar today; Up to date on dental and vision exams;  Review of Systems  Constitutional: Negative.   HENT: Negative.   Eyes: Negative.   Respiratory: Negative.   Cardiovascular: Negative.   Gastrointestinal: Negative.   Genitourinary: Negative.   Musculoskeletal: Negative.   Skin: Negative.   Neurological: Negative.   Endo/Heme/Allergies: Negative.   Psychiatric/Behavioral: Negative.      Objective:  Vitals:   07/10/20 1047  BP: 110/78  Pulse: 73  Temp:  97.9 F (36.6 C)  TempSrc: Oral  SpO2: 97%  Weight: 151 lb 3.2 oz (68.6 kg)  Height: 5' 6.5" (1.689 m)    General: Well developed, well nourished, in no acute distress  Skin : Warm and dry.  Head: Normocephalic and atraumatic  Eyes: Sclera and conjunctiva clear; pupils round and reactive to light; extraocular movements intact  Ears: External normal; canals clear; tympanic membranes normal  Oropharynx: Pink, supple. No suspicious lesions  Neck: Supple without thyromegaly, adenopathy  Lungs: Respirations unlabored; clear to auscultation bilaterally without wheeze, rales, rhonchi  CVS exam: normal rate and regular rhythm.  Abdomen: Soft; nontender; nondistended; normoactive bowel sounds; no masses or hepatosplenomegaly  Musculoskeletal: No deformities; no active joint inflammation  Extremities: No edema, cyanosis, clubbing  Vessels: Symmetric bilaterally  Neurologic: Alert and oriented; speech intact; face symmetrical; moves all extremities well; CNII-XII intact without focal deficit   Assessment:  1. PE (physical exam), annual   2. Screening mammogram, encounter for   3. Encounter for gynecological examination without abnormal finding   4. Lipid screening   5. Vitamin D deficiency     Plan:  Age appropriate preventive healthcare needs addressed; encouraged regular eye doctor and dental exams; encouraged regular exercise; will update labs and refills as needed today; follow-up to be determined; Try increasing Estrace to 0.75 mg daily- she does not want to go back to 0.1 mg due to exacerbation of fibrocystic breast issues; she will discuss further  with new GYN; She will call back with name of medication that she might like to try other than Celexa- she took in the past and did well;  Prevnar updated today;  No follow-ups on file.  Orders Placed This Encounter  Procedures  . MM Digital Screening    Standing Status:   Future    Standing Expiration Date:   07/10/2021    Order  Specific Question:   Reason for Exam (SYMPTOM  OR DIAGNOSIS REQUIRED)    Answer:   screening mammogram    Order Specific Question:   Preferred imaging location?    Answer:   [facility]  . Pneumococcal conjugate vaccine 13-valent  . CBC with Differential/Platelet    Standing Status:   Future    Number of Occurrences:   1    Standing Expiration Date:   07/10/2021  . Comp Met (CMET)    Standing Status:   Future    Number of Occurrences:   1    Standing Expiration Date:   07/10/2021  . Lipid panel    Standing Status:   Future    Number of Occurrences:   1    Standing Expiration Date:   07/10/2021  . TSH    Standing Status:   Future    Number of Occurrences:   1    Standing Expiration Date:   07/10/2021  . Vitamin D (25 hydroxy)    Standing Status:   Future    Number of Occurrences:   1    Standing Expiration Date:   07/10/2021  . Ambulatory referral to Gynecology    Referral Priority:   Routine    Referral Type:   Consultation    Referral Reason:   Specialty Services Required    Requested Specialty:   Gynecology    Number of Visits Requested:   1    Requested Prescriptions   Signed Prescriptions Disp Refills  . citalopram (CELEXA) 20 MG tablet 90 tablet 3    Sig: Take 1 tablet (20 mg total) by mouth daily.  Marland Kitchen LORazepam (ATIVAN) 1 MG tablet 30 tablet 0    Sig: Take 1 tablet (1 mg total) by mouth at bedtime as needed for anxiety.  Marland Kitchen estradiol (ESTRACE) 0.5 MG tablet 135 tablet 1    Sig: Take 1.5 tablets (0.75 mg total) by mouth daily.

## 2020-07-14 ENCOUNTER — Other Ambulatory Visit: Payer: Self-pay | Admitting: Family

## 2020-07-14 DIAGNOSIS — Z1231 Encounter for screening mammogram for malignant neoplasm of breast: Secondary | ICD-10-CM

## 2020-07-21 ENCOUNTER — Telehealth: Payer: Self-pay | Admitting: Family

## 2020-07-21 NOTE — Telephone Encounter (Signed)
Allison Gilbert is out of the office pls advise.Marland KitchenJohny Chess

## 2020-07-21 NOTE — Telephone Encounter (Signed)
Notified pt w/MD response.../lmb 

## 2020-07-21 NOTE — Telephone Encounter (Signed)
This can be a normal reaction.  She can ice the area and apply either benadryl or cortisone cream.  If the redness increases she may need to have this evaluated.

## 2020-07-21 NOTE — Telephone Encounter (Signed)
    Patient calling to report itchy, warm to touch site on arm where she received pneumonia vac on 7/26.  Please advise

## 2020-07-25 ENCOUNTER — Ambulatory Visit
Admission: RE | Admit: 2020-07-25 | Discharge: 2020-07-25 | Disposition: A | Discharge status: Home / Self Care | Payer: Medicare Other | Source: Ambulatory Visit | Attending: Family | Admitting: Family

## 2020-07-25 ENCOUNTER — Other Ambulatory Visit: Payer: Self-pay

## 2020-07-25 DIAGNOSIS — Z1231 Encounter for screening mammogram for malignant neoplasm of breast: Secondary | ICD-10-CM | POA: Diagnosis not present

## 2020-08-04 ENCOUNTER — Ambulatory Visit (INDEPENDENT_AMBULATORY_CARE_PROVIDER_SITE_OTHER): Payer: Medicare Other | Admitting: Obstetrics and Gynecology

## 2020-08-04 ENCOUNTER — Other Ambulatory Visit: Payer: Self-pay

## 2020-08-04 ENCOUNTER — Encounter: Payer: Self-pay | Admitting: Obstetrics and Gynecology

## 2020-08-04 VITALS — BP 118/74 | Ht 66.5 in | Wt 151.0 lb

## 2020-08-04 DIAGNOSIS — Z79890 Hormone replacement therapy (postmenopausal): Secondary | ICD-10-CM | POA: Diagnosis not present

## 2020-08-04 DIAGNOSIS — Z780 Asymptomatic menopausal state: Secondary | ICD-10-CM

## 2020-08-04 DIAGNOSIS — Z01419 Encounter for gynecological examination (general) (routine) without abnormal findings: Secondary | ICD-10-CM

## 2020-08-04 NOTE — Progress Notes (Signed)
   Allison Gilbert 10/22/1956 [id number]  SUBJECTIVE:  64 y.o. G1P1001 female new patient here to establish gynecologic care.  She recently had a pelvic exam with her primary care provider but would like a breast exam today.  She has no gynecologic concerns.  Current Outpatient Medications  Medication Sig Dispense Refill  . Calcium Carbonate-Vitamin D (CALCIUM 500 + D) 500-125 MG-UNIT TABS Take by mouth.    . citalopram (CELEXA) 20 MG tablet Take 1 tablet (20 mg total) by mouth daily. 90 tablet 3  . estradiol (ESTRACE) 0.5 MG tablet Take 1.5 tablets (0.75 mg total) by mouth daily. 135 tablet 1  . LORazepam (ATIVAN) 1 MG tablet Take 1 tablet (1 mg total) by mouth at bedtime as needed for anxiety. 30 tablet 0  . VITAMIN D PO Take 2,000 Units by mouth.     No current facility-administered medications for this visit.   Allergies: Codeine  No LMP recorded. Patient has had a hysterectomy.  Past medical history,surgical history, problem list, medications, allergies, family history and social history were all reviewed and documented as reviewed in the EPIC chart.  ROS:  Feeling well. No dyspnea or chest pain on exertion.  No abdominal pain, change in bowel habits, black or bloody stools.  No urinary tract symptoms. GYN ROS: no abnormal bleeding, pelvic pain or discharge, no breast pain or new or enlarging lumps on self exam.  No neurological complaints.   OBJECTIVE:  BP 118/74   Ht 5' 6.5" (1.689 m)   Wt 151 lb (68.5 kg)   BMI 24.01 kg/m  The patient appears well, alert, oriented x 3, in no distress. Abdomen soft without tenderness, guarding, mass or organomegaly.  Neurological is normal, no focal findings.  BREAST EXAM: breasts appear normal, no suspicious masses, diffuse nonconcerning mobile soft cysts bilaterally, no skin or nipple changes or axillary nodes  PELVIC EXAM: Declined by patient  Chaperone: Caryn Bee present during the examination  ASSESSMENT:  64 y.o. G1P1001 here for  annual gynecologic exam  PLAN:   1. Postmenopausal. Prior hysterectomy BSO for abnormal Pap smear.  Vasomotor symptoms have been managed by her primary care provider she currently takes estradiol 0.75 mg daily, just increased from 0.5 mg daily due to inadequate symptom control.  Discussed that her breast cysts may also be related to her use of estrogen.  Will continue to follow with PCP for estrogen management at this time but we are certainly here for any additional input if needed. 2. Pap smear/HPV 06/2018 [facility]) reported as normal in ce.  She reports normal Pap smears since her hysterectomy.  Next Pap smear due 2024 following the current guidelines recommending the 5 year interval. 3. Mammogram 07/2020.  Normal breast exam today.  Has bilateral breast cysts, dominant cyst on the right lateral breast which has been there for years, she says she has had an imaging work-up in the past which was not concerning.  Continue with annual mammograms. 4. Colonoscopy 2020.  Recommended that she follow up at the recommended interval.   5. DEXA 2018 reported as normal by patient (I do not have copies of the report).  Next DEXA recommended 2023 at 5-year interval if BMD was normal. 6. Health maintenance.  No labs today as she recently had these completed with her primary care provider.   Return annually or sooner, prn.  Joseph Pierini MD 08/04/20

## 2020-11-28 IMAGING — MG DIGITAL SCREENING BILAT W/ TOMO W/ CAD
8 series · 9 of 24 positions shown · non-contrast
Comparison: Previous exam(s).

CLINICAL DATA: Screening.

EXAM:
DIGITAL SCREENING BILATERAL MAMMOGRAM WITH TOMO AND CAD

[R CC synth-2D]
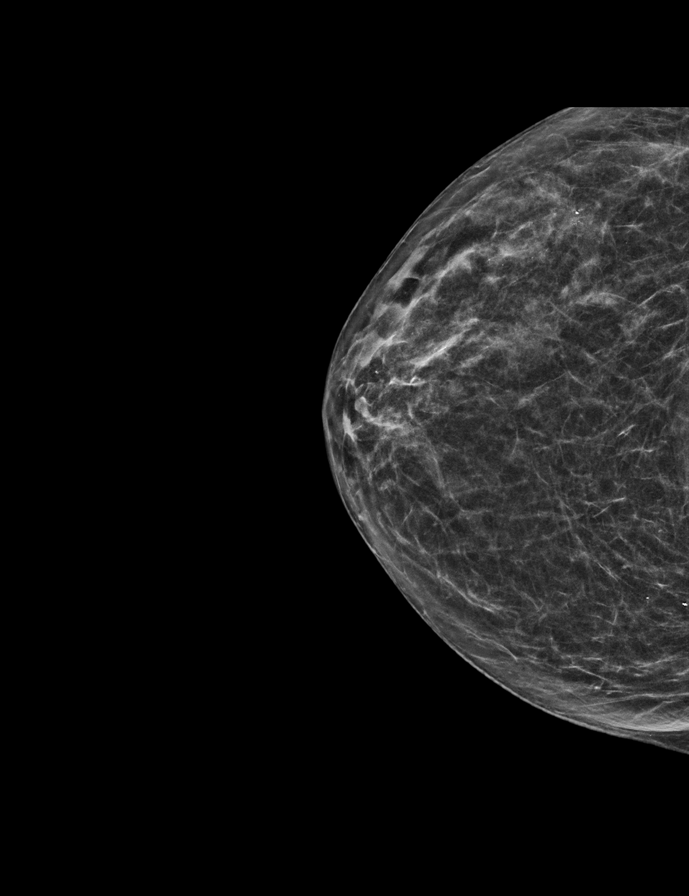

[L CC synth-2D]
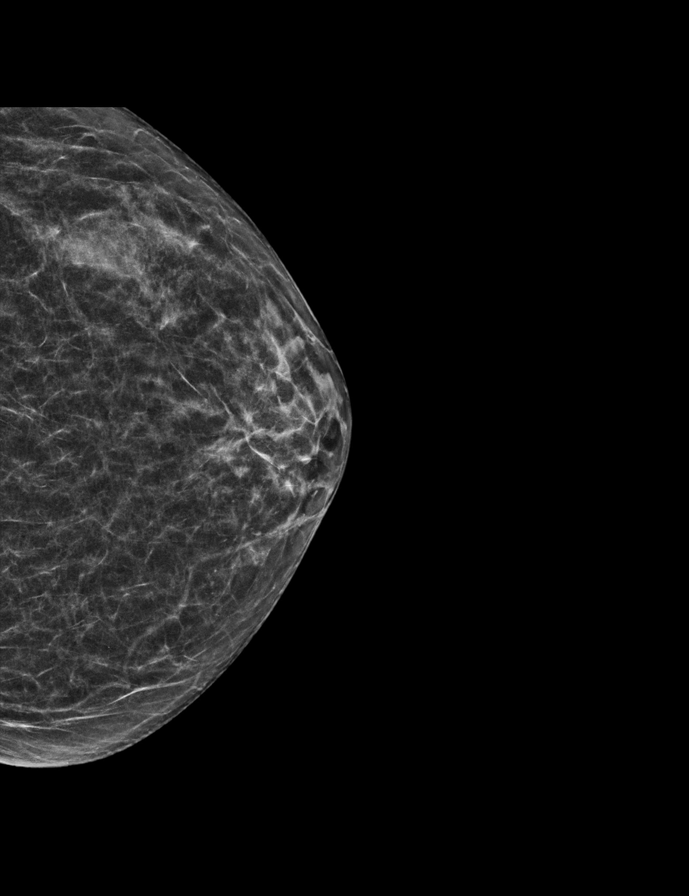

[R MLO synth-2D]
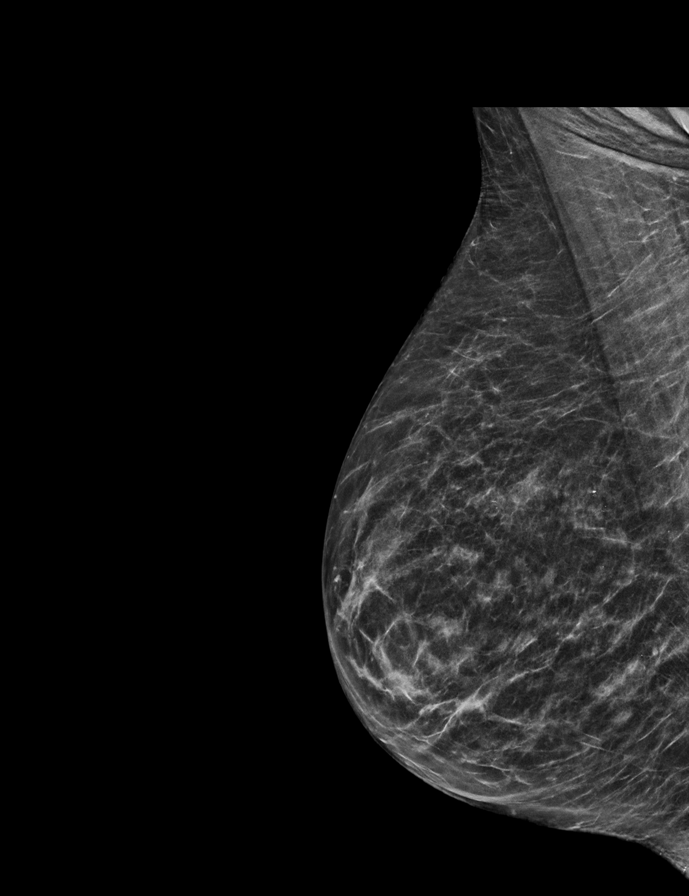

[L MLO synth-2D]
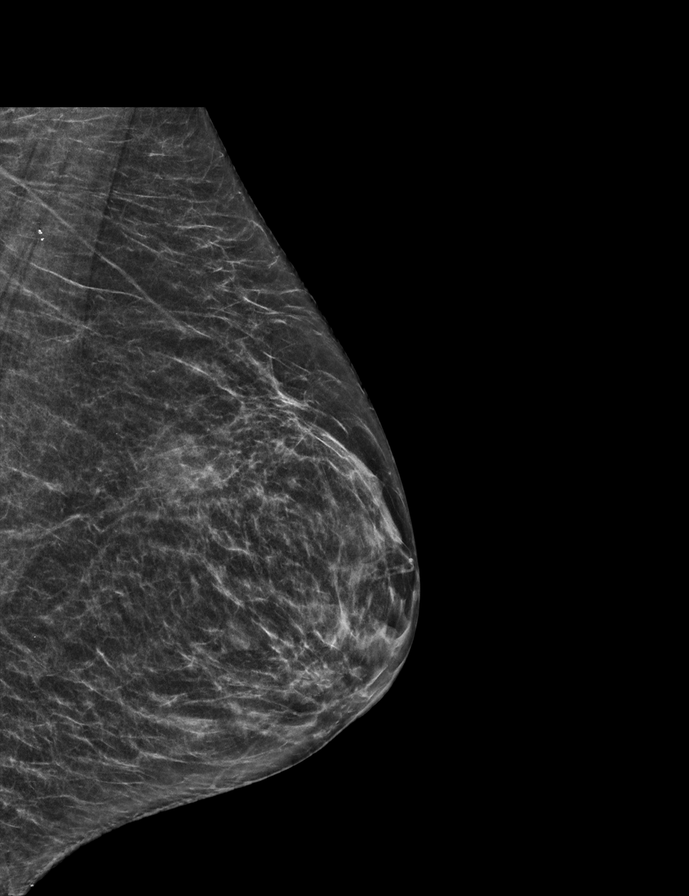

[R CC tomo · 2 of 50 frames shown]
[frame 17/50]
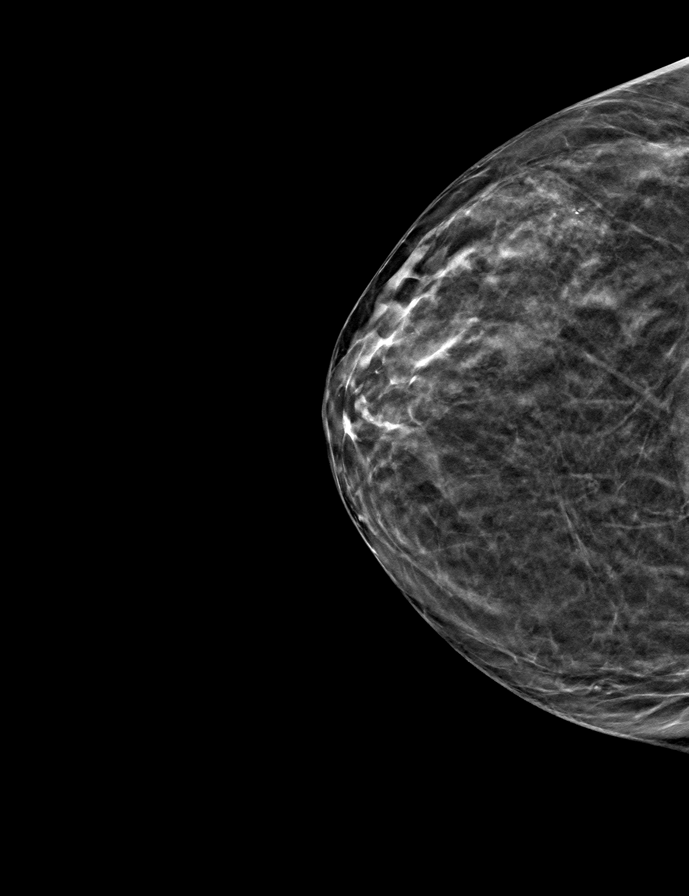
[frame 25/50]
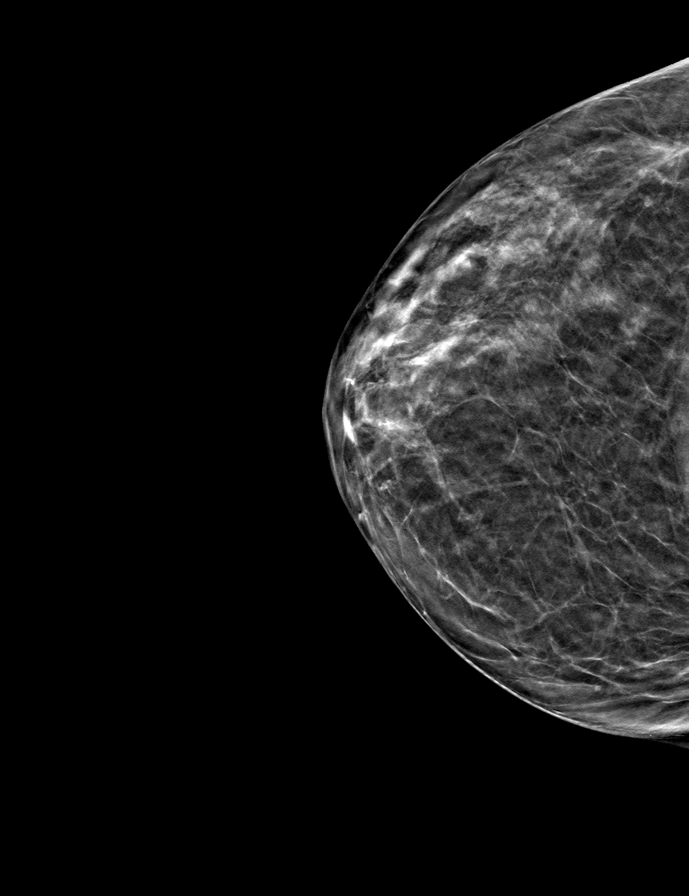

[L CC tomo · tomo slice 25/49.0]
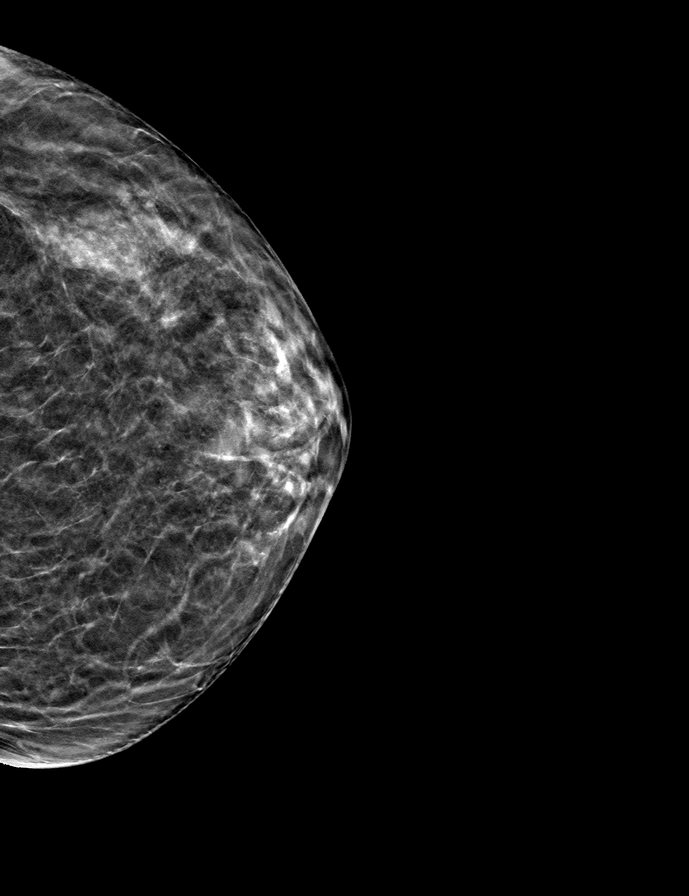

[R MLO tomo · tomo slice 27/52.0]
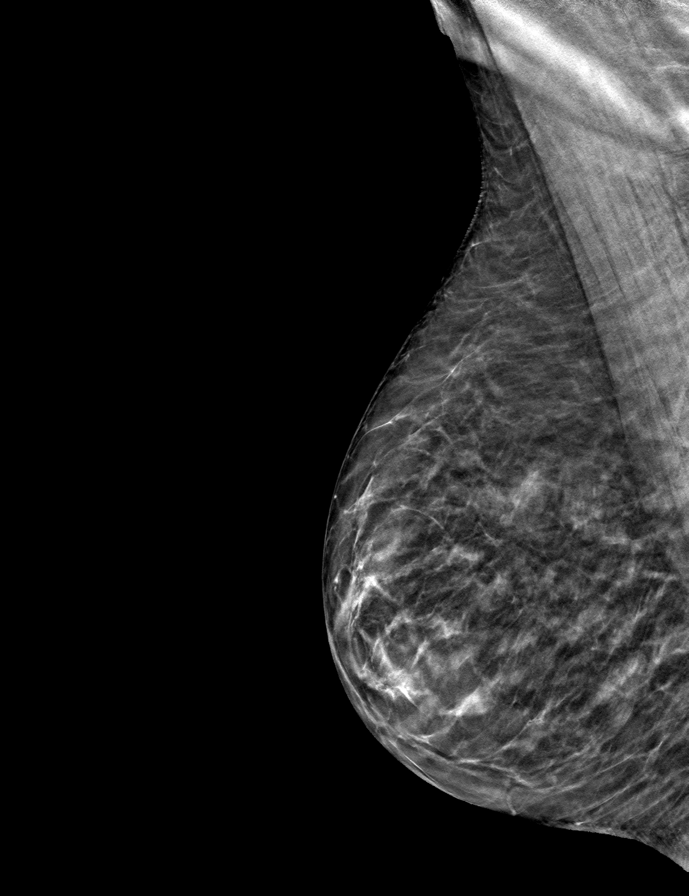

[L MLO tomo · tomo slice 25/50.0]
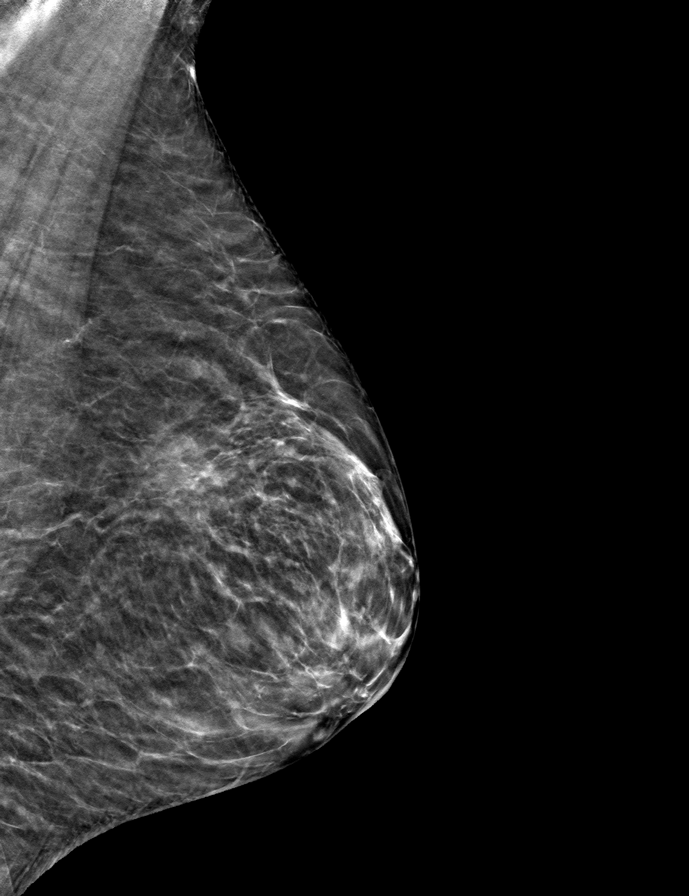

[9 of 24 positions shown; findings below may reference images not displayed]

ACR Breast Density Category c: The breast tissue is heterogeneously
dense, which may obscure small masses.
FINDINGS: There are no findings suspicious for malignancy. Images were
processed with CAD.
IMPRESSION: No mammographic evidence of malignancy. A result letter of this
screening mammogram will be mailed directly to the patient.

RECOMMENDATION:
Screening mammogram in one year. (Code:[id number])

BI-RADS CATEGORY  1: Negative.

## 2021-01-17 DIAGNOSIS — J342 Deviated nasal septum: Secondary | ICD-10-CM | POA: Diagnosis not present

## 2021-01-17 DIAGNOSIS — J343 Hypertrophy of nasal turbinates: Secondary | ICD-10-CM | POA: Diagnosis not present

## 2021-01-23 ENCOUNTER — Other Ambulatory Visit: Payer: Self-pay | Admitting: Family

## 2021-03-23 ENCOUNTER — Telehealth: Payer: Self-pay | Admitting: Family

## 2021-03-23 MED ORDER — ESTRADIOL 0.5 MG PO TABS: ORAL_TABLET | ORAL | 0 refills | Status: DC

## 2021-03-23 NOTE — Telephone Encounter (Signed)
Medication: estradiol (ESTRACE) 0.5 MG tablet [[id number]]    Has the patient contacted their pharmacy? No. (If no, request that the patient contact the pharmacy for the refill.) (If yes, when and what did the pharmacy advise?)  Preferred Pharmacy (with phone number or street name):   [facility] [address] [place], [place] - [address]  [address], [place] [address]  Phone:  [phone] Fax:  [phone]  DEA #:  [id number]  DAW Reason: --     Agent: Please be advised that RX refills may take up to 3 business days. We ask that you follow-up with your pharmacy.

## 2021-04-11 DIAGNOSIS — M25572 Pain in left ankle and joints of left foot: Secondary | ICD-10-CM | POA: Diagnosis not present

## 2021-04-11 DIAGNOSIS — M79671 Pain in right foot: Secondary | ICD-10-CM | POA: Diagnosis not present

## 2021-05-24 DIAGNOSIS — L570 Actinic keratosis: Secondary | ICD-10-CM | POA: Diagnosis not present

## 2021-05-24 DIAGNOSIS — L814 Other melanin hyperpigmentation: Secondary | ICD-10-CM | POA: Diagnosis not present

## 2021-05-24 DIAGNOSIS — L821 Other seborrheic keratosis: Secondary | ICD-10-CM | POA: Diagnosis not present

## 2021-05-24 DIAGNOSIS — D3614 Benign neoplasm of peripheral nerves and autonomic nervous system of thorax: Secondary | ICD-10-CM | POA: Diagnosis not present

## 2021-05-24 DIAGNOSIS — D3617 Benign neoplasm of peripheral nerves and autonomic nervous system of trunk, unspecified: Secondary | ICD-10-CM | POA: Diagnosis not present

## 2021-05-24 DIAGNOSIS — Z85828 Personal history of other malignant neoplasm of skin: Secondary | ICD-10-CM | POA: Diagnosis not present

## 2021-05-24 DIAGNOSIS — D225 Melanocytic nevi of trunk: Secondary | ICD-10-CM | POA: Diagnosis not present

## 2021-05-24 DIAGNOSIS — D224 Melanocytic nevi of scalp and neck: Secondary | ICD-10-CM | POA: Diagnosis not present

## 2021-06-11 ENCOUNTER — Other Ambulatory Visit: Payer: Self-pay | Admitting: Family

## 2021-06-11 DIAGNOSIS — Z1231 Encounter for screening mammogram for malignant neoplasm of breast: Secondary | ICD-10-CM

## 2021-07-04 ENCOUNTER — Telehealth: Payer: Self-pay

## 2021-07-04 NOTE — Telephone Encounter (Signed)
Allison Gilbert Pt:  Pt called and stated that she is covid +. She says she has raspy voice, back ache, joint pains, HA, neck pain, and cough x 2 days. She asks what she needs to do next.   CB number: [phone]

## 2021-07-04 NOTE — Telephone Encounter (Signed)
Allison Gilbert, this was sent to me--do you want to see this patient for vv?

## 2021-07-05 ENCOUNTER — Encounter: Payer: Self-pay | Admitting: Family

## 2021-07-05 ENCOUNTER — Telehealth (INDEPENDENT_AMBULATORY_CARE_PROVIDER_SITE_OTHER): Payer: Medicare Other | Admitting: Family

## 2021-07-05 VITALS — Ht 67.5 in | Wt 145.0 lb

## 2021-07-05 DIAGNOSIS — U071 COVID-19: Secondary | ICD-10-CM

## 2021-07-05 MED ORDER — MOLNUPIRAVIR EUA 200MG CAPSULE: 4.0000 | ORAL_CAPSULE | Freq: Two times a day (BID) | ORAL | 0 refills | Status: AC

## 2021-07-05 NOTE — Telephone Encounter (Signed)
Pt is scheduled for the Covid + VV appt. We will address the concerns then.

## 2021-07-05 NOTE — Progress Notes (Signed)
Allison Gilbert is a 65 y.o. female with the following history as recorded in EpicCare:  Patient Active Problem List   Diagnosis Date Noted   H/O cervical spine surgery 11/18/2018   S/P total hysterectomy and bilateral salpingo-oophorectomy 11/18/2018   GERD (gastroesophageal reflux disease) 10/23/2017   Adjustment disorder with mixed anxiety and depressed mood 09/25/2014   Insomnia 09/25/2014    Current Outpatient Medications  Medication Sig Dispense Refill   Calcium Carbonate-Vitamin D (CALCIUM 500 + D) 500-125 MG-UNIT TABS Take by mouth.     citalopram (CELEXA) 20 MG tablet Take 1 tablet (20 mg total) by mouth daily. 90 tablet 3   estradiol (ESTRACE) 0.5 MG tablet TAKE 1 & 1/2 (ONE & ONE-HALF) TABLETS BY MOUTH ONCE DAILY 135 tablet 0   LORazepam (ATIVAN) 1 MG tablet Take 1 tablet (1 mg total) by mouth at bedtime as needed for anxiety. 30 tablet 0   molnupiravir EUA 200 mg CAPS Take 4 capsules (800 mg total) by mouth 2 (two) times daily for 5 days. 40 capsule 0   VITAMIN D PO Take 2,000 Units by mouth.     No current facility-administered medications for this visit.    Allergies: Codeine  No past medical history on file.  Past Surgical History:  Procedure Laterality Date   ABDOMINAL HYSTERECTOMY     TAH BSO-Due to abnormal pap   cage and plates in neck     plate in heel      Family History  Problem Relation Age of Onset   Stroke Maternal Grandmother     Social History   Tobacco Use   Smoking status: Never   Smokeless tobacco: Never  Substance Use Topics   Alcohol use: Yes    Alcohol/week: 7.0 standard drinks    Types: 7 Glasses of wine per week    Comment: Occas    Subjective:    I connected with Allison Gilbert on 07/05/21 at 11:00 AM EDT by a video enabled telemedicine application and verified that I am speaking with the correct person using two identifiers.   I discussed the limitations of evaluation and management by telemedicine and the availability of in  person appointments. The patient expressed understanding and agreed to proceed. Provider in office/ patient is at home; provider and patient are only 2 people on video call.   Started with cough on Monday of this week; tested positive for COVID yesterday; also accompanied by raspy voice/ sore throat/ joint pain; would be interested in taking anti-viral- still in 5 day window for treatment; using OTC Tylenol with some relief of headache/ body aches.      Objective:  Vitals:   07/05/21 1050  Weight: 145 lb (65.8 kg)  Height: 5' 7.5" (1.715 m)    General: Well developed, well nourished, in no acute distress  Skin : Warm and dry.  Head: Normocephalic and atraumatic  Eyes: Sclera and conjunctiva clear; pupils round and reactive to light;  Lungs: Respirations unlabored;  Neurologic: Alert and oriented; speech intact; face symmetrical;   Assessment:  1. COVID-19     Plan:  Rx for Molnupiravir- take as directed x 5 days; symptomatic treatment discussed and quarantine guidelines reviewed; increase fluids, rest and follow up worse, no better.  No follow-ups on file.  No orders of the defined types were placed in this encounter.   Requested Prescriptions   Signed Prescriptions Disp Refills   molnupiravir EUA 200 mg CAPS 40 capsule 0    Sig: Take  4 capsules (800 mg total) by mouth 2 (two) times daily for 5 days.

## 2021-07-13 ENCOUNTER — Encounter: Payer: Medicare Other | Admitting: Family

## 2021-07-17 ENCOUNTER — Ambulatory Visit (INDEPENDENT_AMBULATORY_CARE_PROVIDER_SITE_OTHER): Payer: Medicare Other | Admitting: Family

## 2021-07-17 ENCOUNTER — Other Ambulatory Visit: Payer: Self-pay

## 2021-07-17 VITALS — BP 104/80 | HR 67 | Temp 97.7°F | Ht 67.0 in | Wt 147.4 lb

## 2021-07-17 DIAGNOSIS — Z0000 Encounter for general adult medical examination without abnormal findings: Secondary | ICD-10-CM

## 2021-07-17 DIAGNOSIS — Z124 Encounter for screening for malignant neoplasm of cervix: Secondary | ICD-10-CM

## 2021-07-17 DIAGNOSIS — M791 Myalgia, unspecified site: Secondary | ICD-10-CM

## 2021-07-17 DIAGNOSIS — Z13220 Encounter for screening for lipoid disorders: Secondary | ICD-10-CM | POA: Diagnosis not present

## 2021-07-17 DIAGNOSIS — E538 Deficiency of other specified B group vitamins: Secondary | ICD-10-CM

## 2021-07-17 DIAGNOSIS — R7989 Other specified abnormal findings of blood chemistry: Secondary | ICD-10-CM | POA: Diagnosis not present

## 2021-07-17 LAB — COMPREHENSIVE METABOLIC PANEL
ALT: 15 U/L (ref 0–35)
AST: 20 U/L (ref 0–37)
Albumin: 4.3 g/dL (ref 3.5–5.2)
Alkaline Phosphatase: 49 U/L (ref 39–117)
BUN: 12 mg/dL (ref 6–23)
CO2: 31 mEq/L (ref 19–32)
Calcium: 9.3 mg/dL (ref 8.4–10.5)
Chloride: 103 mEq/L (ref 96–112)
Creatinine, Ser: 0.64 mg/dL (ref 0.40–1.20)
GFR: 92.27 mL/min (ref >60.00)
Glucose, Bld: 85 mg/dL (ref 70–99)
Potassium: 4 mEq/L (ref 3.5–5.1)
Sodium: 141 mEq/L (ref 135–145)
Total Bilirubin: 1.5 mg/dL — ABNORMAL HIGH (ref 0.2–1.2)
Total Protein: 6.7 g/dL (ref 6.0–8.3)

## 2021-07-17 LAB — CBC WITH DIFFERENTIAL/PLATELET
Basophils Absolute: 0 10*3/uL (ref 0.0–0.1)
Basophils Relative: 0.7 % (ref 0.0–3.0)
Eosinophils Absolute: 0.1 10*3/uL (ref 0.0–0.7)
Eosinophils Relative: 2.6 % (ref 0.0–5.0)
HCT: 39 % (ref 36.0–46.0)
Hemoglobin: 12.8 g/dL (ref 12.0–15.0)
Lymphocytes Relative: 30.9 % (ref 12.0–46.0)
Lymphs Abs: 1.5 10*3/uL (ref 0.7–4.0)
MCHC: 32.8 g/dL (ref 30.0–36.0)
MCV: 95.5 fl (ref 78.0–100.0)
Monocytes Absolute: 0.3 10*3/uL (ref 0.1–1.0)
Monocytes Relative: 5.8 % (ref 3.0–12.0)
Neutro Abs: 2.9 10*3/uL (ref 1.4–7.7)
Neutrophils Relative %: 60 % (ref 43.0–77.0)
Platelets: 180 10*3/uL (ref 150.0–400.0)
RBC: 4.08 Mil/uL (ref 3.87–5.11)
RDW: 13.9 % (ref 11.5–15.5)
WBC: 4.8 10*3/uL (ref 4.0–10.5)

## 2021-07-17 LAB — LIPID PANEL
Cholesterol: 191 mg/dL (ref 0–200)
HDL: 84.6 mg/dL (ref >39.00)
LDL Cholesterol: 90 mg/dL (ref 0–99)
NonHDL: 106.75
Total CHOL/HDL Ratio: 2
Triglycerides: 85 mg/dL (ref 0.0–149.0)
VLDL: 17 mg/dL (ref 0.0–40.0)

## 2021-07-17 LAB — TSH: TSH: 3.18 u[IU]/mL (ref 0.35–5.50)

## 2021-07-17 LAB — VITAMIN D 25 HYDROXY (VIT D DEFICIENCY, FRACTURES): VITD: 43.98 ng/mL (ref 30.00–100.00)

## 2021-07-17 LAB — SEDIMENTATION RATE: Sed Rate: 6 mm/hr (ref 0–30)

## 2021-07-17 LAB — VITAMIN B12: Vitamin B-12: 381 pg/mL (ref 211–911)

## 2021-07-17 MED ORDER — ESTRADIOL 0.5 MG PO TABS: ORAL_TABLET | ORAL | 3 refills | Status: DC

## 2021-07-17 MED ORDER — CITALOPRAM HYDROBROMIDE 10 MG PO TABS
10.0000 mg | ORAL_TABLET | Freq: Every day | ORAL | 3 refills | Status: DC
Start: 2021-07-17 — End: 2022-07-19

## 2021-07-17 MED ORDER — LORAZEPAM 1 MG PO TABS: 1.0000 mg | ORAL_TABLET | Freq: Every evening | ORAL | 0 refills | Status: DC | PRN

## 2021-07-17 NOTE — Progress Notes (Signed)
Allison Gilbert is a 65 y.o. female with the following history as recorded in EpicCare:  Patient Active Problem List   Diagnosis Date Noted   H/O cervical spine surgery 11/18/2018   S/P total hysterectomy and bilateral salpingo-oophorectomy 11/18/2018   GERD (gastroesophageal reflux disease) 10/23/2017   Adjustment disorder with mixed anxiety and depressed mood 09/25/2014   Insomnia 09/25/2014    Current Outpatient Medications  Medication Sig Dispense Refill   Calcium Carbonate-Vitamin D (CALCIUM 500 + D) 500-125 MG-UNIT TABS Take by mouth.     citalopram (CELEXA) 10 MG tablet Take 1 tablet (10 mg total) by mouth daily. 90 tablet 3   VITAMIN D PO Take 1,000 Units by mouth.     estradiol (ESTRACE) 0.5 MG tablet TAKE 1 & 1/2 (ONE & ONE-HALF) TABLETS BY MOUTH ONCE DAILY 135 tablet 3   LORazepam (ATIVAN) 1 MG tablet Take 1 tablet (1 mg total) by mouth at bedtime as needed for anxiety. 30 tablet 0   No current facility-administered medications for this visit.    Allergies: Codeine  No past medical history on file.  Past Surgical History:  Procedure Laterality Date   ABDOMINAL HYSTERECTOMY     TAH BSO-Due to abnormal pap   cage and plates in neck     plate in heel      Family History  Problem Relation Age of Onset   Stroke Maternal Grandmother     Social History   Tobacco Use   Smoking status: Never   Smokeless tobacco: Never  Substance Use Topics   Alcohol use: Yes    Alcohol/week: 7.0 standard drinks    Types: 7 Glasses of wine per week    Comment: Occas    Subjective:  Presents for yearly CPE today; had COVID at the end of July- feeling better; Mammogram is scheduled for later this month; requesting referral to new GYN- her provider has left [place] [facility]; Having increased joint pain in right hand/ left hip and joints- planning to see her orthopedist; knows family history of RA and requesting to be checked today;   Review of Systems  Constitutional: Negative.   HENT: Negative.     Eyes: Negative.   Respiratory: Negative.    Cardiovascular: Negative.   Gastrointestinal: Negative.   Genitourinary: Negative.   Musculoskeletal:  Positive for back pain and joint pain.  Skin: Negative.     Objective:  Vitals:   07/17/21 0927  BP: 104/80  Pulse: 67  Temp: 97.7 F (36.5 C)  TempSrc: Oral  SpO2: 98%  Weight: 147 lb 6.4 oz (66.9 kg)  Height: 5' 7" (1.702 m)    General: Well developed, well nourished, in no acute distress  Skin : Warm and dry.  Head: Normocephalic and atraumatic  Eyes: Sclera and conjunctiva clear; pupils round and reactive to light; extraocular movements intact  Ears: External normal; canals clear; tympanic membranes normal  Oropharynx: Pink, supple. No suspicious lesions  Neck: Supple without thyromegaly, adenopathy  Lungs: Respirations unlabored; clear to auscultation bilaterally without wheeze, rales, rhonchi  CVS exam: normal rate and regular rhythm.  Abdomen: Soft; nontender; nondistended; normoactive bowel sounds; no masses or hepatosplenomegaly  Musculoskeletal: No deformities; no active joint inflammation  Extremities: No edema, cyanosis, clubbing  Vessels: Symmetric bilaterally  Neurologic: Alert and oriented; speech intact; face symmetrical; moves all extremities well; CNII-XII intact without focal deficit  Assessment:  1. PE (physical exam), annual   2. Lipid screening   3. Myalgia   4. Low vitamin B12  level   5. Low vitamin D level   6. Encounter for screening for cervical cancer     Plan:   Age appropriate preventive healthcare needs addressed; encouraged regular eye doctor and dental exams; encouraged regular exercise; will update labs and refills as needed today; follow-up to be determined; Refer to GYN as requested; Plan for DEXA in 2023; plan for pap smear in 2024; colonoscopy in 2030;  She will follow up with her orthopedist and let our office know if she needs referral; Discussed need to get Shingrix, Tdap and  Pneumovax updated- am hesitant to do today since she just had COVID a few weeks ago; she will plan for vaccines at her pharmacy over the next 3-6 months;   This visit occurred during the SARS-CoV-2 public health emergency.  Safety protocols were in place, including screening questions prior to the visit, additional usage of staff PPE, and extensive cleaning of exam room while observing appropriate contact time as indicated for disinfecting solutions.    No follow-ups on file.  Orders Placed This Encounter  Procedures   CBC with Differential/Platelet   Comp Met (CMET)   Lipid panel   TSH   Antinuclear Antib (ANA)   Rheumatoid Factor   Sedimentation rate   B12   Vitamin D (25 hydroxy)   Ambulatory referral to Gynecology    Referral Priority:   Routine    Referral Type:   Consultation    Referral Reason:   Specialty Services Required    Requested Specialty:   Gynecology    Number of Visits Requested:   1    Requested Prescriptions   Signed Prescriptions Disp Refills   estradiol (ESTRACE) 0.5 MG tablet 135 tablet 3    Sig: TAKE 1 & 1/2 (ONE & ONE-HALF) TABLETS BY MOUTH ONCE DAILY   citalopram (CELEXA) 10 MG tablet 90 tablet 3    Sig: Take 1 tablet (10 mg total) by mouth daily.   LORazepam (ATIVAN) 1 MG tablet 30 tablet 0    Sig: Take 1 tablet (1 mg total) by mouth at bedtime as needed for anxiety.

## 2021-07-17 NOTE — Patient Instructions (Addendum)
Please wait another 1-2 months but once you are completely over COVID, please follow up with your pharmacy to get your Tdap and Shingles  and Pneumovax vaccine; Medicare prefers that you get these vaccines at your pharmacy.

## 2021-07-19 ENCOUNTER — Encounter: Payer: Self-pay | Admitting: Family

## 2021-07-19 ENCOUNTER — Other Ambulatory Visit: Payer: Self-pay | Admitting: Family

## 2021-07-19 DIAGNOSIS — R945 Abnormal results of liver function studies: Secondary | ICD-10-CM

## 2021-07-19 LAB — ANA: Anti Nuclear Antibody (ANA): NEGATIVE

## 2021-07-19 LAB — RHEUMATOID FACTOR: Rheumatoid fact SerPl-aCnc: <14 IU/mL (ref <14)

## 2021-07-23 DIAGNOSIS — M5459 Other low back pain: Secondary | ICD-10-CM | POA: Diagnosis not present

## 2021-07-23 DIAGNOSIS — M79644 Pain in right finger(s): Secondary | ICD-10-CM | POA: Diagnosis not present

## 2021-07-23 DIAGNOSIS — M25552 Pain in left hip: Secondary | ICD-10-CM | POA: Diagnosis not present

## 2021-08-02 ENCOUNTER — Other Ambulatory Visit: Payer: Self-pay

## 2021-08-02 ENCOUNTER — Ambulatory Visit
Admission: RE | Admit: 2021-08-02 | Discharge: 2021-08-02 | Disposition: A | Discharge status: Home / Self Care | Payer: Medicare Other | Source: Ambulatory Visit | Attending: Family | Admitting: Family

## 2021-08-02 DIAGNOSIS — Z1231 Encounter for screening mammogram for malignant neoplasm of breast: Secondary | ICD-10-CM | POA: Diagnosis not present

## 2021-08-24 DIAGNOSIS — M545 Low back pain, unspecified: Secondary | ICD-10-CM | POA: Diagnosis not present

## 2021-08-24 DIAGNOSIS — M4316 Spondylolisthesis, lumbar region: Secondary | ICD-10-CM | POA: Diagnosis not present

## 2021-08-24 DIAGNOSIS — M5432 Sciatica, left side: Secondary | ICD-10-CM | POA: Diagnosis not present

## 2021-08-24 DIAGNOSIS — M4726 Other spondylosis with radiculopathy, lumbar region: Secondary | ICD-10-CM | POA: Diagnosis not present

## 2021-09-11 ENCOUNTER — Other Ambulatory Visit: Payer: Self-pay | Admitting: Orthopedic Surgery

## 2021-09-11 DIAGNOSIS — M4316 Spondylolisthesis, lumbar region: Secondary | ICD-10-CM

## 2021-09-18 NOTE — Addendum Note (Signed)
Addended by: Kelle Darting A on: 09/18/2021 04:21 PM   Modules accepted: Orders

## 2021-09-19 ENCOUNTER — Ambulatory Visit
Admission: RE | Admit: 2021-09-19 | Discharge: 2021-09-19 | Disposition: A | Discharge status: Home / Self Care | Payer: Medicare Other | Source: Ambulatory Visit | Attending: Orthopedic Surgery | Admitting: Orthopedic Surgery

## 2021-09-19 DIAGNOSIS — M48061 Spinal stenosis, lumbar region without neurogenic claudication: Secondary | ICD-10-CM | POA: Diagnosis not present

## 2021-09-19 DIAGNOSIS — M4316 Spondylolisthesis, lumbar region: Secondary | ICD-10-CM

## 2021-09-21 DIAGNOSIS — M5432 Sciatica, left side: Secondary | ICD-10-CM | POA: Diagnosis not present

## 2021-09-21 DIAGNOSIS — M5431 Sciatica, right side: Secondary | ICD-10-CM | POA: Diagnosis not present

## 2021-09-21 DIAGNOSIS — M4316 Spondylolisthesis, lumbar region: Secondary | ICD-10-CM | POA: Diagnosis not present

## 2021-09-26 ENCOUNTER — Other Ambulatory Visit: Payer: Self-pay

## 2021-09-26 ENCOUNTER — Other Ambulatory Visit (INDEPENDENT_AMBULATORY_CARE_PROVIDER_SITE_OTHER): Payer: Medicare Other

## 2021-09-26 ENCOUNTER — Other Ambulatory Visit: Payer: Self-pay | Admitting: Family

## 2021-09-26 ENCOUNTER — Telehealth: Payer: Self-pay | Admitting: *Deleted

## 2021-09-26 DIAGNOSIS — R17 Unspecified jaundice: Secondary | ICD-10-CM

## 2021-09-26 DIAGNOSIS — R945 Abnormal results of liver function studies: Secondary | ICD-10-CM

## 2021-09-26 LAB — HEPATIC FUNCTION PANEL
ALT: 10 U/L (ref 0–35)
AST: 18 U/L (ref 0–37)
Albumin: 4.2 g/dL (ref 3.5–5.2)
Alkaline Phosphatase: 45 U/L (ref 39–117)
Bilirubin, Direct: 0.3 mg/dL (ref 0.0–0.3)
Total Bilirubin: 1.7 mg/dL — ABNORMAL HIGH (ref 0.2–1.2)
Total Protein: 6.5 g/dL (ref 6.0–8.3)

## 2021-09-26 LAB — BILIRUBIN, TOTAL: Total Bilirubin: 1.7 mg/dL — ABNORMAL HIGH (ref 0.2–1.2)

## 2021-09-26 NOTE — Telephone Encounter (Signed)
Patient stated that she will do ultrasound, but she had MRI on 09/19/21 and wanted to make sure that getting it would not too much radiation?

## 2021-09-26 NOTE — Telephone Encounter (Signed)
-----   Message from Marrian Salvage, [place] sent at 09/26/2021  1:07 PM EDT ----- Bilirubin is elevated again. I honestly think this is her "normal" but I want to get abdominal ultrasound just to be safe. I will get that scheduled for her.

## 2021-09-27 NOTE — Telephone Encounter (Signed)
Pt called back to report to Mickel Baas she got some readings from her previous provider where her bilirubin was 1.2 in 2020.

## 2021-09-27 NOTE — Telephone Encounter (Signed)
Patient notified about ultrasound and it being ok, but she had put in a message about her billirubin.

## 2021-10-04 DIAGNOSIS — M5416 Radiculopathy, lumbar region: Secondary | ICD-10-CM | POA: Diagnosis not present

## 2021-10-04 DIAGNOSIS — M48062 Spinal stenosis, lumbar region with neurogenic claudication: Secondary | ICD-10-CM | POA: Diagnosis not present

## 2021-10-04 DIAGNOSIS — M4316 Spondylolisthesis, lumbar region: Secondary | ICD-10-CM | POA: Diagnosis not present

## 2021-10-04 DIAGNOSIS — Z6823 Body mass index (BMI) 23.0-23.9, adult: Secondary | ICD-10-CM | POA: Diagnosis not present

## 2021-10-08 ENCOUNTER — Ambulatory Visit
Admission: RE | Admit: 2021-10-08 | Discharge: 2021-10-08 | Disposition: A | Discharge status: Home / Self Care | Payer: Medicare Other | Source: Ambulatory Visit | Attending: Family | Admitting: Family

## 2021-10-08 DIAGNOSIS — R17 Unspecified jaundice: Secondary | ICD-10-CM

## 2021-10-18 DIAGNOSIS — M6281 Muscle weakness (generalized): Secondary | ICD-10-CM | POA: Diagnosis not present

## 2021-10-18 DIAGNOSIS — M545 Low back pain, unspecified: Secondary | ICD-10-CM | POA: Diagnosis not present

## 2021-10-18 DIAGNOSIS — M47816 Spondylosis without myelopathy or radiculopathy, lumbar region: Secondary | ICD-10-CM | POA: Diagnosis not present

## 2021-10-18 DIAGNOSIS — M2569 Stiffness of other specified joint, not elsewhere classified: Secondary | ICD-10-CM | POA: Diagnosis not present

## 2021-10-22 DIAGNOSIS — M47816 Spondylosis without myelopathy or radiculopathy, lumbar region: Secondary | ICD-10-CM | POA: Diagnosis not present

## 2021-10-22 DIAGNOSIS — M6281 Muscle weakness (generalized): Secondary | ICD-10-CM | POA: Diagnosis not present

## 2021-10-22 DIAGNOSIS — M2569 Stiffness of other specified joint, not elsewhere classified: Secondary | ICD-10-CM | POA: Diagnosis not present

## 2021-10-22 DIAGNOSIS — M545 Low back pain, unspecified: Secondary | ICD-10-CM | POA: Diagnosis not present

## 2021-10-24 DIAGNOSIS — M6281 Muscle weakness (generalized): Secondary | ICD-10-CM | POA: Diagnosis not present

## 2021-10-24 DIAGNOSIS — M2569 Stiffness of other specified joint, not elsewhere classified: Secondary | ICD-10-CM | POA: Diagnosis not present

## 2021-10-24 DIAGNOSIS — M545 Low back pain, unspecified: Secondary | ICD-10-CM | POA: Diagnosis not present

## 2021-10-24 DIAGNOSIS — M47816 Spondylosis without myelopathy or radiculopathy, lumbar region: Secondary | ICD-10-CM | POA: Diagnosis not present

## 2022-05-09 ENCOUNTER — Other Ambulatory Visit: Payer: Self-pay | Admitting: Family

## 2022-05-09 DIAGNOSIS — Z1231 Encounter for screening mammogram for malignant neoplasm of breast: Secondary | ICD-10-CM

## 2022-05-27 DIAGNOSIS — D3611 Benign neoplasm of peripheral nerves and autonomic nervous system of face, head, and neck: Secondary | ICD-10-CM | POA: Diagnosis not present

## 2022-05-27 DIAGNOSIS — D2271 Melanocytic nevi of right lower limb, including hip: Secondary | ICD-10-CM | POA: Diagnosis not present

## 2022-05-27 DIAGNOSIS — L814 Other melanin hyperpigmentation: Secondary | ICD-10-CM | POA: Diagnosis not present

## 2022-05-27 DIAGNOSIS — Z85828 Personal history of other malignant neoplasm of skin: Secondary | ICD-10-CM | POA: Diagnosis not present

## 2022-05-27 DIAGNOSIS — L821 Other seborrheic keratosis: Secondary | ICD-10-CM | POA: Diagnosis not present

## 2022-05-27 DIAGNOSIS — R52 Pain, unspecified: Secondary | ICD-10-CM | POA: Diagnosis not present

## 2022-05-27 DIAGNOSIS — D3614 Benign neoplasm of peripheral nerves and autonomic nervous system of thorax: Secondary | ICD-10-CM | POA: Diagnosis not present

## 2022-05-27 DIAGNOSIS — D3617 Benign neoplasm of peripheral nerves and autonomic nervous system of trunk, unspecified: Secondary | ICD-10-CM | POA: Diagnosis not present

## 2022-05-27 DIAGNOSIS — L570 Actinic keratosis: Secondary | ICD-10-CM | POA: Diagnosis not present

## 2022-05-27 DIAGNOSIS — L738 Other specified follicular disorders: Secondary | ICD-10-CM | POA: Diagnosis not present

## 2022-07-02 ENCOUNTER — Telehealth: Payer: Self-pay | Admitting: Family

## 2022-07-02 NOTE — Telephone Encounter (Signed)
Left message for patient to call back and schedule Medicare Annual Wellness Visit (AWV).   Please offer to do virtually or by telephone.  Left office number and my jabber [phone].  AWVI eligible as of  05/16/2021  Please schedule at anytime with Nurse Health Advisor.

## 2022-07-18 ENCOUNTER — Other Ambulatory Visit: Payer: Self-pay | Admitting: Family

## 2022-07-19 ENCOUNTER — Ambulatory Visit: Payer: Medicare Other

## 2022-07-19 ENCOUNTER — Ambulatory Visit (INDEPENDENT_AMBULATORY_CARE_PROVIDER_SITE_OTHER): Payer: Medicare Other | Admitting: Family

## 2022-07-19 VITALS — BP 110/62 | HR 70 | Temp 97.7°F | Resp 18 | Ht 67.0 in | Wt 148.2 lb

## 2022-07-19 DIAGNOSIS — Z13220 Encounter for screening for lipoid disorders: Secondary | ICD-10-CM

## 2022-07-19 DIAGNOSIS — Z23 Encounter for immunization: Secondary | ICD-10-CM

## 2022-07-19 DIAGNOSIS — Z13820 Encounter for screening for osteoporosis: Secondary | ICD-10-CM

## 2022-07-19 DIAGNOSIS — R102 Pelvic and perineal pain: Secondary | ICD-10-CM

## 2022-07-19 DIAGNOSIS — E2839 Other primary ovarian failure: Secondary | ICD-10-CM | POA: Diagnosis not present

## 2022-07-19 DIAGNOSIS — Z0000 Encounter for general adult medical examination without abnormal findings: Secondary | ICD-10-CM

## 2022-07-19 LAB — CBC WITH DIFFERENTIAL/PLATELET
Basophils Absolute: 0 10*3/uL (ref 0.0–0.1)
Basophils Relative: 0.9 % (ref 0.0–3.0)
Eosinophils Absolute: 0.2 10*3/uL (ref 0.0–0.7)
Eosinophils Relative: 3.4 % (ref 0.0–5.0)
HCT: 40.8 % (ref 36.0–46.0)
Hemoglobin: 13.4 g/dL (ref 12.0–15.0)
Lymphocytes Relative: 33.7 % (ref 12.0–46.0)
Lymphs Abs: 1.5 10*3/uL (ref 0.7–4.0)
MCHC: 32.8 g/dL (ref 30.0–36.0)
MCV: 96.8 fl (ref 78.0–100.0)
Monocytes Absolute: 0.3 10*3/uL (ref 0.1–1.0)
Monocytes Relative: 6.4 % (ref 3.0–12.0)
Neutro Abs: 2.6 10*3/uL (ref 1.4–7.7)
Neutrophils Relative %: 55.6 % (ref 43.0–77.0)
Platelets: 151 10*3/uL (ref 150.0–400.0)
RBC: 4.22 Mil/uL (ref 3.87–5.11)
RDW: 13.4 % (ref 11.5–15.5)
WBC: 4.6 10*3/uL (ref 4.0–10.5)

## 2022-07-19 LAB — COMPREHENSIVE METABOLIC PANEL
ALT: 14 U/L (ref 0–35)
AST: 19 U/L (ref 0–37)
Albumin: 4.4 g/dL (ref 3.5–5.2)
Alkaline Phosphatase: 45 U/L (ref 39–117)
BUN: 15 mg/dL (ref 6–23)
CO2: 33 mEq/L — ABNORMAL HIGH (ref 19–32)
Calcium: 9.2 mg/dL (ref 8.4–10.5)
Chloride: 101 mEq/L (ref 96–112)
Creatinine, Ser: 0.62 mg/dL (ref 0.40–1.20)
GFR: 92.32 mL/min (ref >60.00)
Glucose, Bld: 83 mg/dL (ref 70–99)
Potassium: 4.4 mEq/L (ref 3.5–5.1)
Sodium: 139 mEq/L (ref 135–145)
Total Bilirubin: 1.5 mg/dL — ABNORMAL HIGH (ref 0.2–1.2)
Total Protein: 6.7 g/dL (ref 6.0–8.3)

## 2022-07-19 LAB — LIPID PANEL
Cholesterol: 222 mg/dL — ABNORMAL HIGH (ref 0–200)
HDL: 89.1 mg/dL (ref >39.00)
LDL Cholesterol: 118 mg/dL — ABNORMAL HIGH (ref 0–99)
NonHDL: 132.98
Total CHOL/HDL Ratio: 2
Triglycerides: 75 mg/dL (ref 0.0–149.0)
VLDL: 15 mg/dL (ref 0.0–40.0)

## 2022-07-19 MED ORDER — CITALOPRAM HYDROBROMIDE 10 MG PO TABS: 10.0000 mg | ORAL_TABLET | Freq: Every day | ORAL | 3 refills | Status: DC

## 2022-07-19 MED ORDER — ESTRADIOL 0.5 MG PO TABS: 0.5000 mg | ORAL_TABLET | Freq: Every day | ORAL | 3 refills | Status: DC

## 2022-07-19 MED ORDER — LORAZEPAM 1 MG PO TABS: 1.0000 mg | ORAL_TABLET | Freq: Every evening | ORAL | 1 refills | Status: DC | PRN

## 2022-07-19 NOTE — Progress Notes (Deleted)
Subjective:   Allison Gilbert is a 66 y.o. female who presents for an Initial Medicare Annual Wellness Visit.  Review of Systems           Objective:    There were no vitals filed for this visit. There is no height or weight on file to calculate BMI.     06/28/2017    7:44 PM  Advanced Directives  Does Patient Have a Medical Advance Directive? No  Would patient like information on creating a medical advance directive? No - Patient declined    Current Medications (verified) Outpatient Encounter Medications as of 07/19/2022  Medication Sig   Calcium Carbonate-Vitamin D (CALCIUM 500 + D) 500-125 MG-UNIT TABS Take by mouth.   citalopram (CELEXA) 10 MG tablet Take 1 tablet (10 mg total) by mouth daily.   estradiol (ESTRACE) 0.5 MG tablet Take 1 tablet (0.5 mg total) by mouth daily.   LORazepam (ATIVAN) 1 MG tablet Take 1 tablet (1 mg total) by mouth at bedtime as needed for anxiety.   VITAMIN D PO Take 1,000 Units by mouth.   [DISCONTINUED] LORazepam (ATIVAN) 1 MG tablet Take 1 tablet (1 mg total) by mouth at bedtime as needed for anxiety.   No facility-administered encounter medications on file as of 07/19/2022.    Allergies (verified) Codeine   History: No past medical history on file. Past Surgical History:  Procedure Laterality Date   ABDOMINAL HYSTERECTOMY     TAH BSO-Due to abnormal pap   cage and plates in neck     plate in heel     Family History  Problem Relation Age of Onset   Stroke Maternal Grandmother    Social History   Socioeconomic History   Marital status: Married    Spouse name: Not on file   Number of children: Not on file   Years of education: Not on file   Highest education level: Not on file  Occupational History   Not on file  Tobacco Use   Smoking status: Never   Smokeless tobacco: Never  Vaping Use   Vaping Use: Never used  Substance and Sexual Activity   Alcohol use: Yes    Alcohol/week: 7.0 standard drinks of alcohol    Types: 7  Glasses of wine per week    Comment: Occas   Drug use: Never   Sexual activity: Not Currently    Comment: 1st intercourse 19 yo-5 partners  Other Topics Concern   Not on file  Social History Narrative   Not on file   Social Determinants of Health   Financial Resource Strain: Not on file  Food Insecurity: Not on file  Transportation Needs: Not on file  Physical Activity: Not on file  Stress: Not on file  Social Connections: Not on file    Tobacco Counseling Counseling given: Not Answered   Clinical Intake:                 Diabetic?***         Activities of Daily Living     No data to display          Patient Care Team: Marrian Salvage, [place] as PCP - General (Internal Medicine)  Indicate any recent Medical Services you may have received from other than [facility] providers in the past year (date may be approximate).     Assessment:   This is a routine wellness examination for Allison Gilbert.  Hearing/Vision screen No results found.  Dietary issues and exercise  activities discussed:     Goals Addressed   None    Depression Screen    07/19/2022    8:49 AM 07/17/2021    9:30 AM 07/05/2021   10:51 AM  PHQ 2/9 Scores  PHQ - 2 Score 0 0 0    Fall Risk    07/19/2022    8:49 AM 07/17/2021    9:30 AM 07/05/2021   10:51 AM  Fall Risk   Falls in the past year? 0 0 0  Number falls in past yr: 0 0 0  Injury with Fall? 0 0 0  Risk for fall due to :  No Fall Risks No Fall Risks  Follow up  Falls evaluation completed Falls evaluation completed    FALL RISK PREVENTION PERTAINING TO THE HOME:  Any stairs in or around the home? {YES/NO:21197} If so, are there any without handrails? {YES/NO:21197} Home free of loose throw rugs in walkways, pet beds, electrical cords, etc? {YES/NO:21197} Adequate lighting in your home to reduce risk of falls? {YES/NO:21197}  ASSISTIVE DEVICES UTILIZED TO PREVENT FALLS:  Life alert? {YES/NO:21197} Use of a cane, walker or  w/c? {YES/NO:21197} Grab bars in the bathroom? {YES/NO:21197} Shower chair or bench in shower? {YES/NO:21197} Elevated toilet seat or a handicapped toilet? {YES/NO:21197}  TIMED UP AND GO:  Was the test performed? {YES/NO:21197}.  Length of time to ambulate 10 feet: *** sec.   {Appearance of EAVW:0981191}  Cognitive Function:        Immunizations Immunization History  Administered Date(s) Administered   Influenza,inj,Quad PF,6+ Mos 10/17/2015, 11/10/2015, 10/17/2016   Influenza-Unspecified 10/17/2016, 10/23/2017, 11/18/2018, 11/08/2019   PFIZER(Purple Top)SARS-COV-2 Vaccination 03/16/2020, 04/10/2020   PNEUMOCOCCAL CONJUGATE-20 07/19/2022   Pneumococcal Conjugate-13 07/10/2020    {TDAP status:2101805}  {Flu Vaccine status:2101806}  Pneumococcal vaccine status: Up to date  Covid-19 vaccine status: Completed vaccines  Qualifies for Shingles Vaccine? {YES/NO:21197}  Zostavax completed {YES/NO:21197}  {Shingrix Completed?:2101804}  Screening Tests Health Maintenance  Topic Date Due   TETANUS/TDAP  Never done   Zoster Vaccines- Shingrix (1 of 2) Never done   COVID-19 Vaccine (3 - Pfizer series) 06/05/2020   INFLUENZA VACCINE  07/16/2022   MAMMOGRAM  08/03/2023   COLONOSCOPY (Pts 45-48yr Insurance coverage will need to be confirmed)  07/19/2029   Pneumonia Vaccine 67 Years old  Completed   DEXA SCAN  Completed   Hepatitis C Screening  Completed   HPV VACCINES  Aged Out    Health Maintenance  Health Maintenance Due  Topic Date Due   TETANUS/TDAP  Never done   Zoster Vaccines- Shingrix (1 of 2) Never done   COVID-19 Vaccine (3 - Pfizer series) 06/05/2020   INFLUENZA VACCINE  07/16/2022    Colorectal cancer screening: Type of screening: Colonoscopy. Completed 07/20/19. Repeat every 10 years  Mammogram status: Ordered scheduled 08/05/22. Pt provided with contact info and advised to call to schedule appt.   Bone Density status: Ordered 07/19/22. Pt provided with  contact info and advised to call to schedule appt.  Lung Cancer Screening: (Low Dose CT Chest recommended if Age 67-80years, 30 pack-year currently smoking OR have quit w/in 15years.) does not qualify.   Lung Cancer Screening Referral: n/a  Additional Screening:  Hepatitis C Screening: does qualify; Completed 05/30/17  Vision Screening: Recommended annual ophthalmology exams for early detection of glaucoma and other disorders of the eye. Is the patient up to date with their annual eye exam?  {YES/NO:21197} Who is the provider or what is the name of the  office in which the patient attends annual eye exams? *** If pt is not established with a provider, would they like to be referred to a provider to establish care? {YES/NO:21197}.   Dental Screening: Recommended annual dental exams for proper oral hygiene  Community Resource Referral / Chronic Care Management: CRR required this visit?  {YES/NO:21197}  CCM required this visit?  {YES/NO:21197}     Plan:     I have personally reviewed and noted the following in the patient's chart:   Medical and social history Use of alcohol, tobacco or illicit drugs  Current medications and supplements including opioid prescriptions. {Opioid Prescriptions:[phone]} Functional ability and status Nutritional status Physical activity Advanced directives List of other physicians Hospitalizations, surgeries, and ER visits in previous 12 months Vitals Screenings to include cognitive, depression, and falls Referrals and appointments  In addition, I have reviewed and discussed with patient certain preventive protocols, quality metrics, and best practice recommendations. A written personalized care plan for preventive services as well as general preventive health recommendations were provided to patient.     Duard Brady Damarcus Reggio, [place]   07/19/2022   Nurse Notes: ***

## 2022-07-19 NOTE — Patient Instructions (Signed)
Please consider getting your Tdap and Shingrix vaccines updated; due to Medicare guidelines, these will have to be done through your pharmacy;

## 2022-07-19 NOTE — Progress Notes (Signed)
Allison Gilbert is a 66 y.o. female with the following history as recorded in EpicCare:  Patient Active Problem List   Diagnosis Date Noted   H/O cervical spine surgery 11/18/2018   S/P total hysterectomy and bilateral salpingo-oophorectomy 11/18/2018   GERD (gastroesophageal reflux disease) 10/23/2017   Adjustment disorder with mixed anxiety and depressed mood 09/25/2014   Insomnia 09/25/2014    Current Outpatient Medications  Medication Sig Dispense Refill   Calcium Carbonate-Vitamin D (CALCIUM 500 + D) 500-125 MG-UNIT TABS Take by mouth.     LORazepam (ATIVAN) 1 MG tablet Take 1 tablet (1 mg total) by mouth at bedtime as needed for anxiety. 30 tablet 0   VITAMIN D PO Take 1,000 Units by mouth.     citalopram (CELEXA) 10 MG tablet Take 1 tablet (10 mg total) by mouth daily. 90 tablet 3   estradiol (ESTRACE) 0.5 MG tablet Take 1 tablet (0.5 mg total) by mouth daily. 135 tablet 3   No current facility-administered medications for this visit.    Allergies: Codeine  No past medical history on file.  Past Surgical History:  Procedure Laterality Date   ABDOMINAL HYSTERECTOMY     TAH BSO-Due to abnormal pap   cage and plates in neck     plate in heel      Family History  Problem Relation Age of Onset   Stroke Maternal Grandmother     Social History   Tobacco Use   Smoking status: Never   Smokeless tobacco: Never  Substance Use Topics   Alcohol use: Yes    Alcohol/week: 7.0 standard drinks of alcohol    Types: 7 Glasses of wine per week    Comment: Occas    Subjective:   Presents for yearly CPE; notes that husband is having continued health issues and she is more tired as a result; still taking Celexa daily/ having to use Lorazepam more regularly to help her sleep;  Having some localized pain in right pelvic area- has been present "for years"- told previously that normal but concerned due to increased sleep issues;   Mammogram is scheduled for later in August;   Review  of Systems  Constitutional: Negative.   HENT: Negative.    Eyes: Negative.   Respiratory: Negative.    Cardiovascular: Negative.   Gastrointestinal: Negative.   Genitourinary: Negative.   Musculoskeletal:  Positive for back pain.  Skin: Negative.   Neurological: Negative.   Endo/Heme/Allergies: Negative.   Psychiatric/Behavioral:  The patient has insomnia.      Objective:  Vitals:   07/19/22 0840  BP: 110/62  Pulse: 70  Resp: 18  Temp: 97.7 F (36.5 C)  TempSrc: Temporal  SpO2: 99%  Weight: 148 lb 3.2 oz (67.2 kg)  Height: '5\' 7"'  (1.702 m)    General: Well developed, well nourished, in no acute distress  Skin : Warm and dry.  Head: Normocephalic and atraumatic  Eyes: Sclera and conjunctiva clear; pupils round and reactive to light; extraocular movements intact  Ears: External normal; canals clear; tympanic membranes normal  Oropharynx: Pink, supple. No suspicious lesions  Neck: Supple without thyromegaly, adenopathy  Lungs: Respirations unlabored; clear to auscultation bilaterally without wheeze, rales, rhonchi  CVS exam: normal rate and regular rhythm.  Abdomen: Soft; nontender; nondistended; normoactive bowel sounds; no masses or hepatosplenomegaly  Musculoskeletal: No deformities; no active joint inflammation  Extremities: No edema, cyanosis, clubbing  Vessels: Symmetric bilaterally  Neurologic: Alert and oriented; speech intact; face symmetrical; moves all extremities well; CNII-XII  intact without focal deficit   Assessment:  1. PE (physical exam), annual   2. Ovarian failure   3. Screening for osteoporosis   4. Pelvic pain   5. Lipid screening   6. Need for pneumococcal 20-valent conjugate vaccination     Plan:  Age appropriate preventive healthcare needs addressed; encouraged regular eye doctor and dental exams; encouraged regular exercise; will update labs and refills as needed today; follow-up to be determined; Try increasing Celexa to 20 mg and let me  know response;  Will try adding DEXA to mammogram this year; if unable to do, plan for update in 2024; Will plan for pap smear in 2024- to consider tapering estrogen in the future but will not make any adjustments at this time;  Prevnar 20 updated;   No follow-ups on file.  Orders Placed This Encounter  Procedures   DG Bone Density    Standing Status:   Future    Standing Expiration Date:   07/20/2023    Scheduling Instructions:     Please schedule with her upcoming mammogram if possible    Order Specific Question:   Reason for Exam (SYMPTOM  OR DIAGNOSIS REQUIRED)    Answer:   ovarian failure    Order Specific Question:   Preferred imaging location?    Answer:   [facility]   US Pelvic Complete With Transvaginal    Standing Status:   Future    Standing Expiration Date:   07/20/2023    Order Specific Question:   Reason for Exam (SYMPTOM  OR DIAGNOSIS REQUIRED)    Answer:   right sided pelvic pain    Order Specific Question:   Preferred imaging location?    Answer:   [facility]   Pneumococcal conjugate vaccine 20-valent (Prevnar 20)   CBC with Differential/Platelet   Comp Met (CMET)   Lipid panel    Requested Prescriptions   Signed Prescriptions Disp Refills   citalopram (CELEXA) 10 MG tablet 90 tablet 3    Sig: Take 1 tablet (10 mg total) by mouth daily.   estradiol (ESTRACE) 0.5 MG tablet 135 tablet 3    Sig: Take 1 tablet (0.5 mg total) by mouth daily.

## 2022-07-24 ENCOUNTER — Ambulatory Visit (INDEPENDENT_AMBULATORY_CARE_PROVIDER_SITE_OTHER): Payer: Medicare Other

## 2022-07-24 DIAGNOSIS — Z0000 Encounter for general adult medical examination without abnormal findings: Secondary | ICD-10-CM | POA: Diagnosis not present

## 2022-07-24 NOTE — Progress Notes (Addendum)
Subjective:   Allison Gilbert is a 66 y.o. female who presents for an Initial Medicare Annual Wellness Visit.  I connected with  Allison Gilbert on 07/24/22 by a audio enabled telemedicine application and verified that I am speaking with the correct person using two identifiers.  Patient Location: Home  Provider Location: Office/Clinic  I discussed the limitations of evaluation and management by telemedicine. The patient expressed understanding and agreed to proceed.   Review of Systems     Cardiac Risk Factors include: advanced age (>76mn, >>78women)     Objective:    There were no vitals filed for this visit. There is no height or weight on file to calculate BMI.     07/24/2022    1:50 PM 06/28/2017    7:44 PM  Advanced Directives  Does Patient Have a Medical Advance Directive? Yes No  Type of [place] will   Does patient want to make changes to medical advance directive? No - Patient declined   Copy of [place]? No - copy requested   Would patient like information on creating a medical advance directive?  No - Patient declined    Current Medications (verified) Outpatient Encounter Medications as of 07/24/2022  Medication Sig   Calcium Carbonate-Vitamin D (CALCIUM 500 + D) 500-125 MG-UNIT TABS Take by mouth.   citalopram (CELEXA) 10 MG tablet Take 1 tablet (10 mg total) by mouth daily.   estradiol (ESTRACE) 0.5 MG tablet Take 1 tablet (0.5 mg total) by mouth daily.   LORazepam (ATIVAN) 1 MG tablet Take 1 tablet (1 mg total) by mouth at bedtime as needed for anxiety.   VITAMIN D PO Take 1,000 Units by mouth.   No facility-administered encounter medications on file as of 07/24/2022.    Allergies (verified) Codeine   History: History reviewed. No pertinent past medical history. Past Surgical History:  Procedure Laterality Date   ABDOMINAL HYSTERECTOMY     TAH BSO-Due to abnormal pap   cage and  plates in neck     plate in heel     Family History  Problem Relation Age of Onset   Stroke Maternal Grandmother    Social History   Socioeconomic History   Marital status: Married    Spouse name: Not on file   Number of children: Not on file   Years of education: Not on file   Highest education level: Not on file  Occupational History   Not on file  Tobacco Use   Smoking status: Never   Smokeless tobacco: Never  Vaping Use   Vaping Use: Never used  Substance and Sexual Activity   Alcohol use: Yes    Alcohol/week: 7.0 standard drinks of alcohol    Types: 7 Glasses of wine per week    Comment: Occas   Drug use: Never   Sexual activity: Not Currently    Comment: 1st intercourse 19 yo-5 partners  Other Topics Concern   Not on file  Social History Narrative   Not on file   Social Determinants of Health   Financial Resource Strain: Not on file  Food Insecurity: Not on file  Transportation Needs: Not on file  Physical Activity: Not on file  Stress: Not on file  Social Connections: Not on file    Tobacco Counseling Counseling given: Not Answered   Clinical Intake:  Pre-visit preparation completed: Yes  Pain : No/denies pain     BMI -  recorded: 23.21 Nutritional Status: BMI of 19-24  Normal Nutritional Risks: None Diabetes: No  How often do you need to have someone help you when you read instructions, pamphlets, or other written materials from your doctor or pharmacy?: 1 - Never  Diabetic?no  Interpreter Needed?: No  Information entered by :: Madelynn Malson   Activities of Daily Living    07/24/2022    1:52 PM  In your present state of health, do you have any difficulty performing the following activities:  Hearing? 0  Vision? 0  Difficulty concentrating or making decisions? 0  Walking or climbing stairs? 1  Dressing or bathing? 0  Doing errands, shopping? 0  Preparing Food and eating ? N  Using the Toilet? N  In the past six months, have you  accidently leaked urine? Y  Do you have problems with loss of bowel control? N  Managing your Medications? N  Managing your Finances? N    Patient Care Team: Marrian Salvage, FNP as PCP - General (Internal Medicine)  Indicate any recent Medical Services you may have received from other than [facility] providers in the past year (date may be approximate).     Assessment:   This is a routine wellness examination for Carin.  Hearing/Vision screen No results found.  Dietary issues and exercise activities discussed: Current Exercise Habits: Home exercise routine, Type of exercise: walking, Time (Minutes): 60, Frequency (Times/Week): 7, Weekly Exercise (Minutes/Week): 420, Intensity: Mild, Exercise limited by: None identified   Goals Addressed   None    Depression Screen    07/24/2022    1:51 PM 07/19/2022    8:49 AM 07/17/2021    9:30 AM 07/05/2021   10:51 AM  PHQ 2/9 Scores  PHQ - 2 Score 0 0 0 0    Fall Risk    07/24/2022    1:50 PM 07/19/2022    8:49 AM 07/17/2021    9:30 AM 07/05/2021   10:51 AM  Fall Risk   Falls in the past year? 0 0 0 0  Number falls in past yr: 0 0 0 0  Injury with Fall? 0 0 0 0  Risk for fall due to : No Fall Risks  No Fall Risks No Fall Risks  Follow up Falls evaluation completed  Falls evaluation completed Falls evaluation completed    [place]:  Any stairs in or around the home? Yes  If so, are there any without handrails? No  Home free of loose throw rugs in walkways, pet beds, electrical cords, etc? Yes  Adequate lighting in your home to reduce risk of falls? Yes   ASSISTIVE DEVICES UTILIZED TO PREVENT FALLS:  Life alert? No  Use of a cane, walker or w/c? No  Grab bars in the bathroom? No  Shower chair or bench in shower? No  Elevated toilet seat or a handicapped toilet? Yes   TIMED UP AND GO:  Was the test performed? No .    Cognitive Function:        07/24/2022    1:56 PM  6CIT Screen  What  Year? 0 points  What month? 0 points  What time? 0 points  Count back from 20 0 points  Months in reverse 0 points  Repeat phrase 0 points  Total Score 0 points    Immunizations Immunization History  Administered Date(s) Administered   Influenza,inj,Quad PF,6+ Mos 10/17/2015, 11/10/2015, 10/17/2016   Influenza-Unspecified 10/17/2016, 10/23/2017, 11/18/2018, 11/08/2019   PFIZER(Purple  Top)SARS-COV-2 Vaccination 03/16/2020, 04/10/2020, 10/10/2020   PNEUMOCOCCAL CONJUGATE-20 07/19/2022   Pfizer Covid-19 Vaccine Bivalent Booster 41yr & up 11/22/2021   Pneumococcal Conjugate-13 07/10/2020    TDAP status: Due, Education has been provided regarding the importance of this vaccine. Advised may receive this vaccine at local pharmacy or Health Dept. Aware to provide a copy of the vaccination record if obtained from local pharmacy or Health Dept. Verbalized acceptance and understanding.  Flu Vaccine status: Declined, Education has been provided regarding the importance of this vaccine but patient still declined. Advised may receive this vaccine at local pharmacy or Health Dept. Aware to provide a copy of the vaccination record if obtained from local pharmacy or Health Dept. Verbalized acceptance and understanding.  Pneumococcal vaccine status: Up to date  Covid-19 vaccine status: Completed vaccines  Qualifies for Shingles Vaccine? Yes   Zostavax completed No   Shingrix Completed?: No.    Education has been provided regarding the importance of this vaccine. Patient has been advised to call insurance company to determine out of pocket expense if they have not yet received this vaccine. Advised may also receive vaccine at local pharmacy or Health Dept. Verbalized acceptance and understanding.  Screening Tests Health Maintenance  Topic Date Due   TETANUS/TDAP  Never done   Zoster Vaccines- Shingrix (1 of 2) Never done   COVID-19 Vaccine (5 - Pfizer series) 03/23/2022   INFLUENZA VACCINE   07/16/2022   MAMMOGRAM  08/03/2023   COLONOSCOPY (Pts 45-4102yrInsurance coverage will need to be confirmed)  07/19/2029   Pneumonia Vaccine 6556Years old  Completed   DEXA SCAN  Completed   Hepatitis C Screening  Completed   HPV VACCINES  Aged Out    Health Maintenance  Health Maintenance Due  Topic Date Due   TETANUS/TDAP  Never done   Zoster Vaccines- Shingrix (1 of 2) Never done   COVID-19 Vaccine (5 - Pfizer series) 03/23/2022   INFLUENZA VACCINE  07/16/2022    Colorectal cancer screening: Type of screening: Colonoscopy. Completed 07/20/19. Repeat every 10 years  Mammogram status: Ordered scheduled for 08/05/22. Pt provided with contact info and advised to call to schedule appt.   Bone Density status: Ordered scheduled for 01/09/23. Pt provided with contact info and advised to call to schedule appt.  Lung Cancer Screening: (Low Dose CT Chest recommended if Age 67-80ears, 30 pack-year currently smoking OR have quit w/in 15years.) does not qualify.   Lung Cancer Screening Referral: n/a  Additional Screening:  Hepatitis C Screening: does qualify; Completed 05/30/17  Vision Screening: Recommended annual ophthalmology exams for early detection of glaucoma and other disorders of the eye. Is the patient up to date with their annual eye exam?  No  Who is the provider or what is the name of the office in which the patient attends annual eye exams? [facility] is not established with a provider, would they like to be referred to a provider to establish care? No .   Dental Screening: Recommended annual dental exams for proper oral hygiene  Community Resource Referral / Chronic Care Management: CRR required this visit?  No   CCM required this visit?  No      Plan:     I have personally reviewed and noted the following in the patient's chart:   Medical and social history Use of alcohol, tobacco or illicit drugs  Current medications and supplements including  opioid prescriptions. Patient is not currently taking opioid prescriptions. Functional ability  and status Nutritional status Physical activity Advanced directives List of other physicians Hospitalizations, surgeries, and ER visits in previous 12 months Vitals Screenings to include cognitive, depression, and falls Referrals and appointments  In addition, I have reviewed and discussed with patient certain preventive protocols, quality metrics, and best practice recommendations. A written personalized care plan for preventive services as well as general preventive health recommendations were provided to patient.   Due to this being a telephonic visit, the after visit summary with patients personalized plan was offered to patient via mail or my-chart. Patient would like to access on my-chart  Duard Brady Yeison Sippel, [place]   07/24/2022   Nurse Notes: none  I have reviewed and agree with [facility] documentation.  Kathlene November, MD

## 2022-07-24 NOTE — Patient Instructions (Signed)
Allison Gilbert , Thank you for taking time to come for your Medicare Wellness Visit. I appreciate your ongoing commitment to your health goals. Please review the following plan we discussed and let me know if I can assist you in the future.   Screening recommendations/referrals: Colonoscopy: 07/20/19 due 07/19/29 Mammogram: scheduled for 08/05/22 Bone Density: scheduled for 01/09/23 Recommended yearly ophthalmology/optometry visit for glaucoma screening and checkup Recommended yearly dental visit for hygiene and checkup  Vaccinations: Influenza vaccine: Due-May obtain vaccine at our office or your local pharmacy.  Pneumococcal vaccine: up to date Tdap vaccine: Due-May obtain vaccine at your local pharmacy.  Shingles vaccine: Due-May obtain vaccine at your local pharmacy.    Covid-19:completed  Advanced directives: yes, not on file  Conditions/risks identified: see problem list   Next appointment: Follow up in one year for your annual wellness visit    Preventive Care 65 Years and Older, Female Preventive care refers to lifestyle choices and visits with your health care provider that can promote health and wellness. What does preventive care include? A yearly physical exam. This is also called an annual well check. Dental exams once or twice a year. Routine eye exams. Ask your health care provider how often you should have your eyes checked. Personal lifestyle choices, including: Daily care of your teeth and gums. Regular physical activity. Eating a healthy diet. Avoiding tobacco and drug use. Limiting alcohol use. Practicing safe sex. Taking low-dose aspirin every day. Taking vitamin and mineral supplements as recommended by your health care provider. What happens during an annual well check? The services and screenings done by your health care provider during your annual well check will depend on your age, overall health, lifestyle risk factors, and family history of  disease. Counseling  Your health care provider may ask you questions about your: Alcohol use. Tobacco use. Drug use. Emotional well-being. Home and relationship well-being. Sexual activity. Eating habits. History of falls. Memory and ability to understand (cognition). Work and work Statistician. Reproductive health. Screening  You may have the following tests or measurements: Height, weight, and BMI. Blood pressure. Lipid and cholesterol levels. These may be checked every 5 years, or more frequently if you are over 70 years old. Skin check. Lung cancer screening. You may have this screening every year starting at age 32 if you have a 30-pack-year history of smoking and currently smoke or have quit within the past 15 years. Fecal occult blood test (FOBT) of the stool. You may have this test every year starting at age 86. Flexible sigmoidoscopy or colonoscopy. You may have a sigmoidoscopy every 5 years or a colonoscopy every 10 years starting at age 89. Hepatitis C blood test. Hepatitis B blood test. Sexually transmitted disease (STD) testing. Diabetes screening. This is done by checking your blood sugar (glucose) after you have not eaten for a while (fasting). You may have this done every 1-3 years. Bone density scan. This is done to screen for osteoporosis. You may have this done starting at age 13. Mammogram. This may be done every 1-2 years. Talk to your health care provider about how often you should have regular mammograms. Talk with your health care provider about your test results, treatment options, and if necessary, the need for more tests. Vaccines  Your health care provider may recommend certain vaccines, such as: Influenza vaccine. This is recommended every year. Tetanus, diphtheria, and acellular pertussis (Tdap, Td) vaccine. You may need a Td booster every 10 years. Zoster vaccine. You may need this  after age 69. Pneumococcal 13-valent conjugate (PCV13) vaccine. One  dose is recommended after age 55. Pneumococcal polysaccharide (PPSV23) vaccine. One dose is recommended after age 54. Talk to your health care provider about which screenings and vaccines you need and how often you need them. This information is not intended to replace advice given to you by your health care provider. Make sure you discuss any questions you have with your health care provider. Document Released: 12/29/2015 Document Revised: 08/21/2016 Document Reviewed: 10/03/2015 Elsevier Interactive Patient Education  2017 [place] Prevention in the Home Falls can cause injuries. They can happen to people of all ages. There are many things you can do to make your home safe and to help prevent falls. What can I do on the outside of my home? Regularly fix the edges of walkways and driveways and fix any cracks. Remove anything that might make you trip as you walk through a door, such as a raised step or threshold. Trim any bushes or trees on the path to your home. Use bright outdoor lighting. Clear any walking paths of anything that might make someone trip, such as rocks or tools. Regularly check to see if handrails are loose or broken. Make sure that both sides of any steps have handrails. Any raised decks and porches should have guardrails on the edges. Have any leaves, snow, or ice cleared regularly. Use sand or salt on walking paths during winter. Clean up any spills in your garage right away. This includes oil or grease spills. What can I do in the bathroom? Use night lights. Install grab bars by the toilet and in the tub and shower. Do not use towel bars as grab bars. Use non-skid mats or decals in the tub or shower. If you need to sit down in the shower, use a plastic, non-slip stool. Keep the floor dry. Clean up any water that spills on the floor as soon as it happens. Remove soap buildup in the tub or shower regularly. Attach bath mats securely with double-sided  non-slip rug tape. Do not have throw rugs and other things on the floor that can make you trip. What can I do in the bedroom? Use night lights. Make sure that you have a light by your bed that is easy to reach. Do not use any sheets or blankets that are too big for your bed. They should not hang down onto the floor. Have a firm chair that has side arms. You can use this for support while you get dressed. Do not have throw rugs and other things on the floor that can make you trip. What can I do in the kitchen? Clean up any spills right away. Avoid walking on wet floors. Keep items that you use a lot in easy-to-reach places. If you need to reach something above you, use a strong step stool that has a grab bar. Keep electrical cords out of the way. Do not use floor polish or wax that makes floors slippery. If you must use wax, use non-skid floor wax. Do not have throw rugs and other things on the floor that can make you trip. What can I do with my stairs? Do not leave any items on the stairs. Make sure that there are handrails on both sides of the stairs and use them. Fix handrails that are broken or loose. Make sure that handrails are as long as the stairways. Check any carpeting to make sure that it is firmly attached to the  stairs. Fix any carpet that is loose or worn. Avoid having throw rugs at the top or bottom of the stairs. If you do have throw rugs, attach them to the floor with carpet tape. Make sure that you have a light switch at the top of the stairs and the bottom of the stairs. If you do not have them, ask someone to add them for you. What else can I do to help prevent falls? Wear shoes that: Do not have high heels. Have rubber bottoms. Are comfortable and fit you well. Are closed at the toe. Do not wear sandals. If you use a stepladder: Make sure that it is fully opened. Do not climb a closed stepladder. Make sure that both sides of the stepladder are locked into place. Ask  someone to hold it for you, if possible. Clearly mark and make sure that you can see: Any grab bars or handrails. First and last steps. Where the edge of each step is. Use tools that help you move around (mobility aids) if they are needed. These include: Canes. Walkers. Scooters. Crutches. Turn on the lights when you go into a dark area. Replace any light bulbs as soon as they burn out. Set up your furniture so you have a clear path. Avoid moving your furniture around. If any of your floors are uneven, fix them. If there are any pets around you, be aware of where they are. Review your medicines with your doctor. Some medicines can make you feel dizzy. This can increase your chance of falling. Ask your doctor what other things that you can do to help prevent falls. This information is not intended to replace advice given to you by your health care provider. Make sure you discuss any questions you have with your health care provider. Document Released: 09/28/2009 Document Revised: 05/09/2016 Document Reviewed: 01/06/2015 Elsevier Interactive Patient Education  2017 [employer].

## 2022-07-26 ENCOUNTER — Other Ambulatory Visit: Payer: Self-pay | Admitting: Family

## 2022-07-26 ENCOUNTER — Ambulatory Visit
Admission: RE | Admit: 2022-07-26 | Discharge: 2022-07-26 | Disposition: A | Discharge status: Home / Self Care | Payer: Medicare Other | Source: Ambulatory Visit | Attending: Family | Admitting: Family

## 2022-07-26 DIAGNOSIS — R102 Pelvic and perineal pain: Secondary | ICD-10-CM

## 2022-08-05 ENCOUNTER — Ambulatory Visit
Admission: RE | Admit: 2022-08-05 | Discharge: 2022-08-05 | Disposition: A | Discharge status: Home / Self Care | Payer: Medicare Other | Source: Ambulatory Visit | Attending: Family | Admitting: Family

## 2022-08-05 DIAGNOSIS — Z1231 Encounter for screening mammogram for malignant neoplasm of breast: Secondary | ICD-10-CM | POA: Diagnosis not present

## 2022-08-22 ENCOUNTER — Encounter: Payer: Self-pay | Admitting: Family Medicine

## 2022-08-22 ENCOUNTER — Ambulatory Visit (INDEPENDENT_AMBULATORY_CARE_PROVIDER_SITE_OTHER): Payer: Medicare Other | Admitting: Family Medicine

## 2022-08-22 VITALS — BP 98/60 | HR 73 | Temp 97.9°F | Resp 18 | Ht 67.0 in

## 2022-08-22 DIAGNOSIS — L237 Allergic contact dermatitis due to plants, except food: Secondary | ICD-10-CM | POA: Insufficient documentation

## 2022-08-22 MED ORDER — TRIAMCINOLONE ACETONIDE 0.1 % EX CREA: 1.0000 | TOPICAL_CREAM | Freq: Two times a day (BID) | CUTANEOUS | 0 refills | Status: DC

## 2022-08-22 MED ORDER — METHYLPREDNISOLONE ACETATE 80 MG/ML IJ SUSP
80.0000 mg | Freq: Once | INTRAMUSCULAR | Status: AC
  Administered 2022-08-22: 80 mg via INTRAMUSCULAR

## 2022-08-22 MED ORDER — PREDNISONE 10 MG PO TABS: ORAL_TABLET | ORAL | 0 refills | Status: DC

## 2022-08-22 NOTE — Patient Instructions (Signed)
Poison Ivy Dermatitis Poison ivy dermatitis is inflammation of the skin that is caused by chemicals in the leaves of the poison ivy plant. The skin reaction often involves redness, swelling, blisters, and extreme itching. What are the causes? This condition is caused by a chemical (urushiol) found in the sap of the poison ivy plant. This chemical is sticky and can be easily spread to people, animals, and objects. You can get poison ivy dermatitis by: Having direct contact with a poison ivy plant. Touching animals, other people, or objects that have come in contact with poison ivy and have the chemical on them. What increases the risk? This condition is more likely to develop in people who: Are outdoors often in wooded or [place] areas. Go outdoors without wearing protective clothing, such as closed shoes, long pants, and a long-sleeved shirt. What are the signs or symptoms? Symptoms of this condition include: Redness of the skin. Extreme itching. A rash that often includes bumps and blisters. The rash usually appears 48 hours after exposure, if you have been exposed before. If this is the first time you have been exposed, the rash may not appear until a week after exposure. Swelling. This may occur if the reaction is more severe. Symptoms usually last for 1-2 weeks. However, the first time you develop this condition, symptoms may last 3-4 weeks. How is this diagnosed? This condition may be diagnosed based on your symptoms and a physical exam. Your health care provider may also ask you about any recent outdoor activity. How is this treated? Treatment for this condition will vary depending on how severe it is. Treatment may include: Hydrocortisone cream or calamine lotion to relieve itching. Oatmeal baths to soothe the skin. Medicines, such as over-the-counter antihistamine tablets. Oral steroid medicine, for more severe reactions. Follow these instructions at home: Medicines Take or apply  over-the-counter and prescription medicines only as told by your health care provider. Use hydrocortisone cream or calamine lotion as needed to soothe the skin and relieve itching. General instructions Do not scratch or rub your skin. Apply a cold, wet cloth (cold compress) to the affected areas or take baths in cool water. This will help with itching. Avoid hot baths and showers. Take oatmeal baths as needed. Use colloidal oatmeal. You can get this at your local pharmacy or grocery store. Follow the instructions on the packaging. While you have the rash, wash clothes right after you wear them. Keep all follow-up visits as told by your health care provider. This is important. How is this prevented?  Learn to identify the poison ivy plant and avoid contact with the plant. This plant can be recognized by the number of leaves. Generally, poison ivy has three leaves with flowering branches on a single stem. The leaves are typically glossy, and they have jagged edges that come to a point at the front. If you have been exposed to poison ivy, thoroughly wash with soap and water right away. You have about 30 minutes to remove the plant resin before it will cause the rash. Be sure to wash under your fingernails, because any plant resin there will continue to spread the rash. When hiking or camping, wear clothes that will help you to avoid exposure on the skin. This includes long pants, a long-sleeved shirt, tall socks, and hiking boots. You can also apply preventive lotion to your skin to help limit exposure. If you suspect that your clothes or outdoor gear came in contact with poison ivy, rinse them off outside  with a garden hose before you bring them inside your house. When doing yard work or gardening, wear gloves, long sleeves, long pants, and boots. Wash your garden tools and gloves if they come in contact with poison ivy. If you suspect that your pet has come into contact with poison ivy, wash him or her  with pet shampoo and water. Make sure to wear gloves while washing your pet. Contact a health care provider if you have: Open sores in the rash area. More redness, swelling, or pain in the affected area. Redness that spreads beyond the rash area. Fluid, blood, or pus coming from the affected area. A fever. A rash over a large area of your body. A rash on your eyes, mouth, or genitals. A rash that does not improve after a few weeks. Get help right away if: Your face swells or your eyes swell shut. You have trouble breathing. You have trouble swallowing. These symptoms may represent a serious problem that is an emergency. Do not wait to see if the symptoms will go away. Get medical help right away. Call your local emergency services (911 in the [place] Do not drive yourself to the hospital. Summary Poison ivy dermatitis is inflammation of the skin that is caused by chemicals in the leaves of the poison ivy plant. Symptoms of this condition include redness, itching, a rash, and swelling. Do not scratch or rub your skin. Take or apply over-the-counter and prescription medicines only as told by your health care provider. This information is not intended to replace advice given to you by your health care provider. Make sure you discuss any questions you have with your health care provider. Document Revised: 09/17/2021 Document Reviewed: 09/17/2021 Elsevier Patient Education  [place].

## 2022-08-22 NOTE — Assessment & Plan Note (Signed)
pred taper Depo medrol 80 mg I'm Benadryl prn itching  Triamcinolone cream

## 2022-08-22 NOTE — Progress Notes (Signed)
Established Patient Office Visit  Subjective   Patient ID: Allison Gilbert, female    DOB: 22-Apr-1956  Age: 66 y.o. MRN: [id number]  Chief Complaint  Patient presents with   [place]    Pt states having spots on chest, butt, arms.x2 days    HPI Pt here c/o poison ivy rash on arms, abd and legs.  It is very itchy.  She was in contact with the poison 2 days ago and rash started yesterday.   Patient Active Problem List   Diagnosis Date Noted   Allergic contact dermatitis due to plants, except food 08/22/2022   H/O cervical spine surgery 11/18/2018   S/P total hysterectomy and bilateral salpingo-oophorectomy 11/18/2018   GERD (gastroesophageal reflux disease) 10/23/2017   Adjustment disorder with mixed anxiety and depressed mood 09/25/2014   Insomnia 09/25/2014   No past medical history on file. Past Surgical History:  Procedure Laterality Date   ABDOMINAL HYSTERECTOMY     TAH BSO-Due to abnormal pap   cage and plates in neck     plate in heel     Social History   Tobacco Use   Smoking status: Never   Smokeless tobacco: Never  Vaping Use   Vaping Use: Never used  Substance Use Topics   Alcohol use: Yes    Alcohol/week: 7.0 standard drinks of alcohol    Types: 7 Glasses of wine per week    Comment: Occas   Drug use: Never   Social History   Socioeconomic History   Marital status: Married    Spouse name: Not on file   Number of children: Not on file   Years of education: Not on file   Highest education level: Not on file  Occupational History   Not on file  Tobacco Use   Smoking status: Never   Smokeless tobacco: Never  Vaping Use   Vaping Use: Never used  Substance and Sexual Activity   Alcohol use: Yes    Alcohol/week: 7.0 standard drinks of alcohol    Types: 7 Glasses of wine per week    Comment: Occas   Drug use: Never   Sexual activity: Not Currently    Comment: 1st intercourse 19 yo-5 partners  Other Topics Concern   Not on file  Social History  Narrative   Not on file   Social Determinants of Health   Financial Resource Strain: Low Risk  (07/24/2022)   Overall Financial Resource Strain (CARDIA)    Difficulty of Paying Living Expenses: Not hard at all  Food Insecurity: No Food Insecurity (07/24/2022)   Hunger Vital Sign    Worried About Running Out of Food in the Last Year: Never true    [place] in the Last Year: Never true  Transportation Needs: No Transportation Needs (07/24/2022)   PRAPARE - Hydrologist (Medical): No    Lack of Transportation (Non-Medical): No  Physical Activity: Sufficiently Active (07/24/2022)   Exercise Vital Sign    Days of Exercise per Week: 7 days    Minutes of Exercise per Session: 60 min  Stress: Stress Concern Present (07/24/2022)   [place]    Feeling of Stress : Rather much  Social Connections: Moderately Isolated (07/24/2022)   Social Connection and Isolation Panel [NHANES]    Frequency of Communication with Friends and Family: Once a week    Frequency of Social Gatherings with Friends and Family: Never  Attends Religious Services: More than 4 times per year    Active Member of Clubs or Organizations: No    Attends Archivist Meetings: Never    Marital Status: Married  Human resources officer Violence: Not At Risk (07/24/2022)   Humiliation, Afraid, Rape, and Kick questionnaire    Fear of Current or Ex-Partner: No    Emotionally Abused: No    Physically Abused: No    Sexually Abused: No   Family Status  Relation Name Status   MGM  (Not Specified)   Family History  Problem Relation Age of Onset   Stroke Maternal Grandmother    Allergies  Allergen Reactions   Codeine Nausea Only and Nausea And Vomiting    Vomiting "Many years ago"       Review of Systems  Constitutional:  Negative for fever and malaise/fatigue.  HENT:  Negative for congestion.   Eyes:  Negative for blurred  vision.  Respiratory:  Negative for shortness of breath.   Cardiovascular:  Negative for chest pain, palpitations and leg swelling.  Gastrointestinal:  Negative for abdominal pain, blood in stool and nausea.  Genitourinary:  Negative for dysuria and frequency.  Musculoskeletal:  Negative for falls.  Skin:  Positive for itching and rash.  Neurological:  Negative for dizziness, loss of consciousness and headaches.  Endo/Heme/Allergies:  Negative for environmental allergies.  Psychiatric/Behavioral:  Negative for depression. The patient is not nervous/anxious.       Objective:     BP 98/60 (BP Location: Right Arm, Patient Position: Sitting, Cuff Size: Normal)   Pulse 73   Temp 97.9 F (36.6 C) (Oral)   Resp 18   Ht '5\' 7"'$  (1.702 m)   SpO2 98%   BMI 23.21 kg/m  BP Readings from Last 3 Encounters:  08/22/22 98/60  07/19/22 110/62  07/17/21 104/80   Wt Readings from Last 3 Encounters:  07/19/22 148 lb 3.2 oz (67.2 kg)  07/17/21 147 lb 6.4 oz (66.9 kg)  07/05/21 145 lb (65.8 kg)   SpO2 Readings from Last 3 Encounters:  08/22/22 98%  07/19/22 99%  07/17/21 98%      Physical Exam Vitals and nursing note reviewed.  Constitutional:      Appearance: She is well-developed.  HENT:     Head: Normocephalic and atraumatic.  Eyes:     Conjunctiva/sclera: Conjunctivae normal.  Neck:     Thyroid: No thyromegaly.     Vascular: No carotid bruit or JVD.  Cardiovascular:     Rate and Rhythm: Normal rate and regular rhythm.     Heart sounds: Normal heart sounds. No murmur heard. Pulmonary:     Effort: Pulmonary effort is normal. No respiratory distress.     Breath sounds: Normal breath sounds. No wheezing or rales.  Chest:     Chest wall: No tenderness.  Musculoskeletal:     Cervical back: Normal range of motion and neck supple.  Skin:    Findings: Rash present.  Neurological:     Mental Status: She is alert and oriented to person, place, and time.      No results found  for any visits on 08/22/22.  Last CBC Lab Results  Component Value Date   WBC 4.6 07/19/2022   HGB 13.4 07/19/2022   HCT 40.8 07/19/2022   MCV 96.8 07/19/2022   MCH 31.8 07/10/2020   RDW 13.4 07/19/2022   PLT 151.0 38/46/6599   Last metabolic panel Lab Results  Component Value Date   GLUCOSE 83 07/19/2022  NA 139 07/19/2022   K 4.4 07/19/2022   CL 101 07/19/2022   CO2 33 (H) 07/19/2022   BUN 15 07/19/2022   CREATININE 0.62 07/19/2022   GFRNONAA >60 06/28/2017   CALCIUM 9.2 07/19/2022   PROT 6.7 07/19/2022   ALBUMIN 4.4 07/19/2022   BILITOT 1.5 (H) 07/19/2022   ALKPHOS 45 07/19/2022   AST 19 07/19/2022   ALT 14 07/19/2022   ANIONGAP 9 06/28/2017   Last lipids Lab Results  Component Value Date   CHOL 222 (H) 07/19/2022   HDL 89.10 07/19/2022   LDLCALC 118 (H) 07/19/2022   TRIG 75.0 07/19/2022   CHOLHDL 2 07/19/2022   Last hemoglobin A1c No results found for: "HGBA1C" Last thyroid functions Lab Results  Component Value Date   TSH 3.18 07/17/2021   Last vitamin D Lab Results  Component Value Date   VD25OH 43.98 07/17/2021   Last vitamin B12 and Folate Lab Results  Component Value Date   VITAMINB12 381 07/17/2021      The 10-year ASCVD risk score (Arnett DK, et al., 2019) is: 3.7%    Assessment & Plan:   Problem List Items Addressed This Visit       Unprioritized   Allergic contact dermatitis due to plants, except food - Primary    pred taper Depo medrol 80 mg I'm Benadryl prn itching  Triamcinolone cream       Relevant Medications   predniSONE (DELTASONE) 10 MG tablet   triamcinolone cream (KENALOG) 0.1 %    Return if symptoms worsen or fail to improve.    Ann Held, DO

## 2022-08-28 DIAGNOSIS — L821 Other seborrheic keratosis: Secondary | ICD-10-CM | POA: Diagnosis not present

## 2022-08-28 DIAGNOSIS — L820 Inflamed seborrheic keratosis: Secondary | ICD-10-CM | POA: Diagnosis not present

## 2022-08-28 DIAGNOSIS — L738 Other specified follicular disorders: Secondary | ICD-10-CM | POA: Diagnosis not present

## 2022-11-05 DIAGNOSIS — H2513 Age-related nuclear cataract, bilateral: Secondary | ICD-10-CM | POA: Diagnosis not present

## 2022-11-05 DIAGNOSIS — H25013 Cortical age-related cataract, bilateral: Secondary | ICD-10-CM | POA: Diagnosis not present

## 2022-11-05 DIAGNOSIS — H25043 Posterior subcapsular polar age-related cataract, bilateral: Secondary | ICD-10-CM | POA: Diagnosis not present

## 2022-11-05 DIAGNOSIS — H18413 Arcus senilis, bilateral: Secondary | ICD-10-CM | POA: Diagnosis not present

## 2022-11-17 ENCOUNTER — Other Ambulatory Visit: Payer: Self-pay | Admitting: Family

## 2022-11-22 ENCOUNTER — Other Ambulatory Visit: Payer: Self-pay | Admitting: Family

## 2022-11-22 ENCOUNTER — Telehealth: Payer: Self-pay | Admitting: Family

## 2022-11-22 MED ORDER — CITALOPRAM HYDROBROMIDE 20 MG PO TABS
20.0000 mg | ORAL_TABLET | Freq: Every day | ORAL | 3 refills | Status: DC
Start: 2022-11-22 — End: 2023-08-19

## 2022-11-22 NOTE — Telephone Encounter (Signed)
Please advise 

## 2022-11-22 NOTE — Telephone Encounter (Signed)
Medication: citalopram (CELEXA) 10 MG tablet   Has the patient contacted their pharmacy? Yes  New prescription is needed for 2 pills per day due to dosage increase made by pcp.   Preferred Pharmacy (with phone number or street name): [facility] [address] [place], [place] - [address], [place] [address] Phone: [phone]  Fax: [phone]   Agent: Please be advised that RX refills may take up to 3 business days. We ask that you follow-up with your pharmacy.

## 2022-12-06 DIAGNOSIS — M19019 Primary osteoarthritis, unspecified shoulder: Secondary | ICD-10-CM | POA: Diagnosis not present

## 2022-12-06 DIAGNOSIS — M19071 Primary osteoarthritis, right ankle and foot: Secondary | ICD-10-CM | POA: Diagnosis not present

## 2022-12-06 DIAGNOSIS — M79671 Pain in right foot: Secondary | ICD-10-CM | POA: Diagnosis not present

## 2022-12-06 DIAGNOSIS — M21621 Bunionette of right foot: Secondary | ICD-10-CM | POA: Diagnosis not present

## 2022-12-06 DIAGNOSIS — R29898 Other symptoms and signs involving the musculoskeletal system: Secondary | ICD-10-CM | POA: Diagnosis not present

## 2022-12-31 ENCOUNTER — Ambulatory Visit: Payer: Medicare Other | Admitting: Family

## 2023-01-07 ENCOUNTER — Encounter: Payer: Self-pay | Admitting: Family

## 2023-01-07 ENCOUNTER — Ambulatory Visit (INDEPENDENT_AMBULATORY_CARE_PROVIDER_SITE_OTHER): Payer: Medicare Other | Admitting: Family

## 2023-01-07 VITALS — BP 96/59 | HR 83 | Ht 67.0 in | Wt 144.6 lb

## 2023-01-07 DIAGNOSIS — R32 Unspecified urinary incontinence: Secondary | ICD-10-CM

## 2023-01-07 NOTE — Progress Notes (Signed)
  Allison Gilbert is a 67 y.o. female with the following history as recorded in EpicCare:  Patient Active Problem List   Diagnosis Date Noted   Allergic contact dermatitis due to plants, except food 08/22/2022   H/O cervical spine surgery 11/18/2018   S/P total hysterectomy and bilateral salpingo-oophorectomy 11/18/2018   GERD (gastroesophageal reflux disease) 10/23/2017   Adjustment disorder with mixed anxiety and depressed mood 09/25/2014   Insomnia 09/25/2014    Current Outpatient Medications  Medication Sig Dispense Refill   Calcium Carbonate-Vitamin D (CALCIUM 500 + D) 500-125 MG-UNIT TABS Take by mouth.     citalopram (CELEXA) 20 MG tablet Take 1 tablet (20 mg total) by mouth daily. 90 tablet 3   estradiol (ESTRACE) 0.5 MG tablet TAKE 1 & 1/2 (ONE & ONE-HALF) TABLETS BY MOUTH ONCE DAILY 135 tablet 0   LORazepam (ATIVAN) 1 MG tablet Take 1 tablet (1 mg total) by mouth at bedtime as needed for anxiety. 30 tablet 1   VITAMIN D PO Take 1,000 Units by mouth.     No current facility-administered medications for this visit.    Allergies: Codeine  No past medical history on file.  Past Surgical History:  Procedure Laterality Date   ABDOMINAL HYSTERECTOMY     TAH BSO-Due to abnormal pap   cage and plates in neck     plate in heel      Family History  Problem Relation Age of Onset   Stroke Maternal Grandmother     Social History   Tobacco Use   Smoking status: Never   Smokeless tobacco: Never  Substance Use Topics   Alcohol use: Yes    Alcohol/week: 7.0 standard drinks of alcohol    Types: 7 Glasses of wine per week    Comment: Occas    Subjective:  Would like to discuss referral to specialist to discuss management of urinary incontinence; does not want to take medication until after speaking with specialist;    Objective:  Vitals:   01/07/23 1305  BP: (!) 96/59  Pulse: 83  Weight: 144 lb 9.6 oz (65.6 kg)  Height: '5\' 7"'$  (1.702 m)    General: Well developed, well  nourished, in no acute distress  Skin : Warm and dry.  Head: Normocephalic and atraumatic  Lungs: Respirations unlabored;  Neurologic: Alert and oriented; speech intact; face symmetrical; moves all extremities well; CNII-XII intact without focal deficit   Assessment:  1. Urinary incontinence, unspecified type     Plan:  Refer to urogynecology; medication deferred at this time; follow up to be determined.    No follow-ups on file.  Orders Placed This Encounter  Procedures   Ambulatory referral to Urogynecology    Referral Priority:   Routine    Referral Type:   Consultation    Referral Reason:   Specialty Services Required    Requested Specialty:   Urology    Number of Visits Requested:   1    Requested Prescriptions    No prescriptions requested or ordered in this encounter

## 2023-01-09 ENCOUNTER — Ambulatory Visit
Admission: RE | Admit: 2023-01-09 | Discharge: 2023-01-09 | Disposition: A | Discharge status: Home / Self Care | Payer: Medicare Other | Source: Ambulatory Visit | Attending: Family | Admitting: Family

## 2023-01-09 DIAGNOSIS — M85851 Other specified disorders of bone density and structure, right thigh: Secondary | ICD-10-CM | POA: Diagnosis not present

## 2023-01-09 DIAGNOSIS — E2839 Other primary ovarian failure: Secondary | ICD-10-CM

## 2023-01-09 DIAGNOSIS — Z780 Asymptomatic menopausal state: Secondary | ICD-10-CM | POA: Diagnosis not present

## 2023-01-09 DIAGNOSIS — Z13820 Encounter for screening for osteoporosis: Secondary | ICD-10-CM

## 2023-01-27 ENCOUNTER — Telehealth: Payer: Self-pay | Admitting: Family

## 2023-01-27 NOTE — Telephone Encounter (Signed)
Spoke with pt to let her know the referral had been sent on 01/07/2023. Provided pt with the phone number to the [facility] gynecology office.

## 2023-01-27 NOTE — Telephone Encounter (Signed)
Pt states she has not received a call from the UROGYNECOLOGY office.

## 2023-02-12 ENCOUNTER — Other Ambulatory Visit: Payer: Self-pay | Admitting: Family

## 2023-04-23 ENCOUNTER — Telehealth: Payer: Self-pay | Admitting: Family

## 2023-04-23 NOTE — Telephone Encounter (Signed)
Pt called stating that she was supposed to get some labs done after her ones from her last CPE. After reviewing chart, did not see any active lab orders and advised a note would be sent back to look into this for her.

## 2023-04-24 ENCOUNTER — Other Ambulatory Visit: Payer: Self-pay | Admitting: Family

## 2023-04-24 DIAGNOSIS — R899 Unspecified abnormal finding in specimens from other organs, systems and tissues: Secondary | ICD-10-CM

## 2023-04-24 NOTE — Telephone Encounter (Signed)
Pt is scheduled for lab appt 04/28/2023.

## 2023-04-24 NOTE — Telephone Encounter (Signed)
Spoke with pt, pt states she has been very tired lately and is worried about her Bilirubin lab from last visit, pt states she would like to come in to have labs rechecked, pt said she is fine with either a OV or just a lab visit. Pt would like to check hemoglobin, glucose and bilirubin.

## 2023-04-28 ENCOUNTER — Other Ambulatory Visit (INDEPENDENT_AMBULATORY_CARE_PROVIDER_SITE_OTHER): Payer: Medicare Other

## 2023-04-28 DIAGNOSIS — R899 Unspecified abnormal finding in specimens from other organs, systems and tissues: Secondary | ICD-10-CM | POA: Diagnosis not present

## 2023-04-28 LAB — COMPREHENSIVE METABOLIC PANEL
ALT: 11 U/L (ref 0–35)
AST: 16 U/L (ref 0–37)
Albumin: 4.2 g/dL (ref 3.5–5.2)
Alkaline Phosphatase: 40 U/L (ref 39–117)
BUN: 13 mg/dL (ref 6–23)
CO2: 30 mEq/L (ref 19–32)
Calcium: 9.2 mg/dL (ref 8.4–10.5)
Chloride: 101 mEq/L (ref 96–112)
Creatinine, Ser: 0.64 mg/dL (ref 0.40–1.20)
GFR: 91.13 mL/min (ref >60.00)
Glucose, Bld: 80 mg/dL (ref 70–99)
Potassium: 4.1 mEq/L (ref 3.5–5.1)
Sodium: 138 mEq/L (ref 135–145)
Total Bilirubin: 1.6 mg/dL — ABNORMAL HIGH (ref 0.2–1.2)
Total Protein: 6.5 g/dL (ref 6.0–8.3)

## 2023-04-28 LAB — CBC WITH DIFFERENTIAL/PLATELET
Basophils Absolute: 0 10*3/uL (ref 0.0–0.1)
Basophils Relative: 1.3 % (ref 0.0–3.0)
Eosinophils Absolute: 0.2 10*3/uL (ref 0.0–0.7)
Eosinophils Relative: 4.6 % (ref 0.0–5.0)
HCT: 39.6 % (ref 36.0–46.0)
Hemoglobin: 13.3 g/dL (ref 12.0–15.0)
Lymphocytes Relative: 34.8 % (ref 12.0–46.0)
Lymphs Abs: 1.3 10*3/uL (ref 0.7–4.0)
MCHC: 33.6 g/dL (ref 30.0–36.0)
MCV: 95.8 fl (ref 78.0–100.0)
Monocytes Absolute: 0.3 10*3/uL (ref 0.1–1.0)
Monocytes Relative: 6.6 % (ref 3.0–12.0)
Neutro Abs: 2 10*3/uL (ref 1.4–7.7)
Neutrophils Relative %: 52.7 % (ref 43.0–77.0)
Platelets: 167 10*3/uL (ref 150.0–400.0)
RBC: 4.13 Mil/uL (ref 3.87–5.11)
RDW: 13.4 % (ref 11.5–15.5)
WBC: 3.9 10*3/uL — ABNORMAL LOW (ref 4.0–10.5)

## 2023-04-28 LAB — HEMOGLOBIN A1C: Hgb A1c MFr Bld: 5.6 % (ref 4.6–6.5)

## 2023-05-14 ENCOUNTER — Other Ambulatory Visit: Payer: Self-pay | Admitting: Family

## 2023-05-16 DIAGNOSIS — M6289 Other specified disorders of muscle: Secondary | ICD-10-CM | POA: Diagnosis not present

## 2023-05-16 DIAGNOSIS — K5900 Constipation, unspecified: Secondary | ICD-10-CM | POA: Diagnosis not present

## 2023-05-16 DIAGNOSIS — N3281 Overactive bladder: Secondary | ICD-10-CM | POA: Diagnosis not present

## 2023-05-16 DIAGNOSIS — R319 Hematuria, unspecified: Secondary | ICD-10-CM | POA: Diagnosis not present

## 2023-05-29 DIAGNOSIS — D2262 Melanocytic nevi of left upper limb, including shoulder: Secondary | ICD-10-CM | POA: Diagnosis not present

## 2023-05-29 DIAGNOSIS — L820 Inflamed seborrheic keratosis: Secondary | ICD-10-CM | POA: Diagnosis not present

## 2023-05-29 DIAGNOSIS — L308 Other specified dermatitis: Secondary | ICD-10-CM | POA: Diagnosis not present

## 2023-05-29 DIAGNOSIS — L570 Actinic keratosis: Secondary | ICD-10-CM | POA: Diagnosis not present

## 2023-05-29 DIAGNOSIS — D485 Neoplasm of uncertain behavior of skin: Secondary | ICD-10-CM | POA: Diagnosis not present

## 2023-05-29 DIAGNOSIS — D3613 Benign neoplasm of peripheral nerves and autonomic nervous system of lower limb, including hip: Secondary | ICD-10-CM | POA: Diagnosis not present

## 2023-05-29 DIAGNOSIS — L814 Other melanin hyperpigmentation: Secondary | ICD-10-CM | POA: Diagnosis not present

## 2023-05-29 DIAGNOSIS — Z85828 Personal history of other malignant neoplasm of skin: Secondary | ICD-10-CM | POA: Diagnosis not present

## 2023-06-30 DIAGNOSIS — S61111A Laceration without foreign body of right thumb with damage to nail, initial encounter: Secondary | ICD-10-CM | POA: Diagnosis not present

## 2023-07-29 ENCOUNTER — Telehealth: Payer: Self-pay | Admitting: Family

## 2023-07-29 NOTE — Telephone Encounter (Signed)
Copied from CRM [id number]. Topic: Medicare AWV >> Jul 29, 2023 11:51 AM Payton Doughty wrote: Reason for CRM: LM 07/29/2023 to schedule AWV   Verlee Rossetti; Care Guide Ambulatory Clinical Support [place] [facility] Direct Dial: [phone]

## 2023-08-19 ENCOUNTER — Ambulatory Visit (INDEPENDENT_AMBULATORY_CARE_PROVIDER_SITE_OTHER): Payer: Medicare Other | Admitting: Family

## 2023-08-19 ENCOUNTER — Encounter: Payer: Self-pay | Admitting: Family

## 2023-08-19 VITALS — BP 128/64 | HR 73 | Resp 18 | Ht 67.0 in | Wt 146.8 lb

## 2023-08-19 DIAGNOSIS — Z0000 Encounter for general adult medical examination without abnormal findings: Secondary | ICD-10-CM

## 2023-08-19 DIAGNOSIS — D509 Iron deficiency anemia, unspecified: Secondary | ICD-10-CM

## 2023-08-19 DIAGNOSIS — R413 Other amnesia: Secondary | ICD-10-CM | POA: Diagnosis not present

## 2023-08-19 DIAGNOSIS — Z636 Dependent relative needing care at home: Secondary | ICD-10-CM

## 2023-08-19 DIAGNOSIS — E559 Vitamin D deficiency, unspecified: Secondary | ICD-10-CM | POA: Diagnosis not present

## 2023-08-19 MED ORDER — CITALOPRAM HYDROBROMIDE 10 MG PO TABS: 10.0000 mg | ORAL_TABLET | Freq: Every day | ORAL | 3 refills | Status: DC

## 2023-08-19 MED ORDER — ESTRADIOL 0.5 MG PO TABS
0.7500 mg | ORAL_TABLET | Freq: Every day | ORAL | 3 refills | Status: DC
Start: 2023-08-19 — End: 2024-06-14

## 2023-08-19 NOTE — Progress Notes (Signed)
Allison Gilbert is a 67 y.o. female with the following history as recorded in EpicCare:  Patient Active Problem List   Diagnosis Date Noted   Allergic contact dermatitis due to plants, except food 08/22/2022   H/O cervical spine surgery 11/18/2018   S/P total hysterectomy and bilateral salpingo-oophorectomy 11/18/2018   GERD (gastroesophageal reflux disease) 10/23/2017   Adjustment disorder with mixed anxiety and depressed mood 09/25/2014   Insomnia 09/25/2014    Current Outpatient Medications  Medication Sig Dispense Refill   Calcium Carbonate-Vitamin D (CALCIUM 500 + D) 500-125 MG-UNIT TABS Take by mouth.     citalopram (CELEXA) 10 MG tablet Take 1 tablet (10 mg total) by mouth daily. 90 tablet 3   LORazepam (ATIVAN) 1 MG tablet Take 1 tablet (1 mg total) by mouth at bedtime as needed for anxiety. 30 tablet 1   VITAMIN D PO Take 1,000 Units by mouth.     estradiol (ESTRACE) 0.5 MG tablet Take 1.5 tablets (0.75 mg total) by mouth daily. 135 tablet 3   No current facility-administered medications for this visit.    Allergies: Codeine  No past medical history on file.  Past Surgical History:  Procedure Laterality Date   ABDOMINAL HYSTERECTOMY     TAH BSO-Due to abnormal pap   cage and plates in neck     plate in heel      Family History  Problem Relation Age of Onset   Stroke Maternal Grandmother     Social History   Tobacco Use   Smoking status: Never   Smokeless tobacco: Never  Substance Use Topics   Alcohol use: Yes    Alcohol/week: 7.0 standard drinks of alcohol    Types: 7 Glasses of wine per week    Comment: Occas    Subjective:   Presents for yearly CPE; has been having sensation that depth perception is off slightly/ occasional memory changes- attributing to stress she is under;   Review of Systems  Constitutional: Negative.   HENT: Negative.    Eyes: Negative.   Respiratory: Negative.    Cardiovascular: Negative.   Gastrointestinal: Negative.    Genitourinary: Negative.   Musculoskeletal: Negative.   Skin: Negative.   Neurological:  Positive for sensory change.  Endo/Heme/Allergies: Negative.   Psychiatric/Behavioral: Negative.        Objective:  Vitals:   08/19/23 1403  BP: 128/64  Pulse: 73  Resp: 18  SpO2: 99%  Weight: 146 lb 12.8 oz (66.6 kg)  Height: 5\' 7"  (1.702 m)    General: Well developed, well nourished, in no acute distress  Skin : Warm and dry.  Head: Normocephalic and atraumatic  Eyes: Sclera and conjunctiva clear; pupils round and reactive to light; extraocular movements intact  Ears: External normal; canals clear; tympanic membranes normal  Oropharynx: Pink, supple. No suspicious lesions  Neck: Supple without thyromegaly, adenopathy  Lungs: Respirations unlabored; clear to auscultation bilaterally without wheeze, rales, rhonchi  CVS exam: normal rate and regular rhythm.  Abdomen: Soft; nontender; nondistended; normoactive bowel sounds; no masses or hepatosplenomegaly  Musculoskeletal: No deformities; no active joint inflammation  Extremities: No edema, cyanosis, clubbing  Vessels: Symmetric bilaterally  Neurologic: Alert and oriented; speech intact; face symmetrical; moves all extremities well; CNII-XII intact without focal deficit   Assessment:  1. PE (physical exam), annual   2. Memory changes   3. Vitamin D deficiency   4. Iron deficiency anemia, unspecified iron deficiency anemia type   5. Caregiver stress     Plan:  Age appropriate preventive healthcare needs addressed; encouraged regular eye doctor and dental exams; encouraged regular exercise; will update labs and refills as needed today; follow-up to be determined; Referral updated to see what/ if any resources available for caregiver stress relief; If labs today are normal, to consider referral to neurology due to changes in memory/ depth perception;    No follow-ups on file.  Orders Placed This Encounter  Procedures   B12   TSH    Vitamin D (25 hydroxy)   IBC + Ferritin   Comp Met (CMET)   CBC with Differential/Platelet   AMB Referral to Community Care Coordinaton (ACO Patients)    Referral Priority:   Routine    Referral Type:   Consultation    Referral Reason:   Care Coordination    Number of Visits Requested:   1    Requested Prescriptions   Signed Prescriptions Disp Refills   citalopram (CELEXA) 10 MG tablet 90 tablet 3    Sig: Take 1 tablet (10 mg total) by mouth daily.   estradiol (ESTRACE) 0.5 MG tablet 135 tablet 3    Sig: Take 1.5 tablets (0.75 mg total) by mouth daily.

## 2023-08-19 NOTE — Patient Instructions (Signed)
Please get flu and RSV at [facility] in 2-3 weeks

## 2023-08-20 ENCOUNTER — Telehealth: Payer: Self-pay | Admitting: *Deleted

## 2023-08-20 LAB — COMPREHENSIVE METABOLIC PANEL
ALT: 13 U/L (ref 0–35)
AST: 18 U/L (ref 0–37)
Albumin: 4.1 g/dL (ref 3.5–5.2)
Alkaline Phosphatase: 51 U/L (ref 39–117)
BUN: 15 mg/dL (ref 6–23)
CO2: 34 meq/L — ABNORMAL HIGH (ref 19–32)
Calcium: 9.5 mg/dL (ref 8.4–10.5)
Chloride: 99 meq/L (ref 96–112)
Creatinine, Ser: 0.65 mg/dL (ref 0.40–1.20)
GFR: 90.59 mL/min (ref >60.00)
Glucose, Bld: 92 mg/dL (ref 70–99)
Potassium: 4 meq/L (ref 3.5–5.1)
Sodium: 140 meq/L (ref 135–145)
Total Bilirubin: 0.9 mg/dL (ref 0.2–1.2)
Total Protein: 6.5 g/dL (ref 6.0–8.3)

## 2023-08-20 LAB — VITAMIN B12: Vitamin B-12: 358 pg/mL (ref 211–911)

## 2023-08-20 LAB — CBC WITH DIFFERENTIAL/PLATELET
Basophils Absolute: 0.1 10*3/uL (ref 0.0–0.1)
Basophils Relative: 0.9 % (ref 0.0–3.0)
Eosinophils Absolute: 0.2 10*3/uL (ref 0.0–0.7)
Eosinophils Relative: 3.5 % (ref 0.0–5.0)
HCT: 39.9 % (ref 36.0–46.0)
Hemoglobin: 12.9 g/dL (ref 12.0–15.0)
Lymphocytes Relative: 26.4 % (ref 12.0–46.0)
Lymphs Abs: 1.6 10*3/uL (ref 0.7–4.0)
MCHC: 32.3 g/dL (ref 30.0–36.0)
MCV: 96.9 fl (ref 78.0–100.0)
Monocytes Absolute: 0.3 10*3/uL (ref 0.1–1.0)
Monocytes Relative: 5.7 % (ref 3.0–12.0)
Neutro Abs: 3.9 10*3/uL (ref 1.4–7.7)
Neutrophils Relative %: 63.5 % (ref 43.0–77.0)
Platelets: 181 10*3/uL (ref 150.0–400.0)
RBC: 4.12 Mil/uL (ref 3.87–5.11)
RDW: 13.4 % (ref 11.5–15.5)
WBC: 6.1 10*3/uL (ref 4.0–10.5)

## 2023-08-20 LAB — TSH: TSH: 3.01 u[IU]/mL (ref 0.35–5.50)

## 2023-08-20 LAB — IBC + FERRITIN
Ferritin: 41.3 ng/mL (ref 10.0–291.0)
Iron: 89 ug/dL (ref 42–145)
Saturation Ratios: 26.2 % (ref 20.0–50.0)
TIBC: 340.2 ug/dL (ref 250.0–450.0)
Transferrin: 243 mg/dL (ref 212.0–360.0)

## 2023-08-20 LAB — VITAMIN D 25 HYDROXY (VIT D DEFICIENCY, FRACTURES): VITD: 57.85 ng/mL (ref 30.00–100.00)

## 2023-08-20 NOTE — Progress Notes (Signed)
  Care Coordination  Outreach Note  08/20/2023 Name: Allison Gilbert MRN: [id number] DOB: 06-18-1956   Care Coordination Outreach Attempts: An unsuccessful telephone outreach was attempted today to offer the patient information about available care coordination services.  Follow Up Plan:  Additional outreach attempts will be made to offer the patient care coordination information and services.   Encounter Outcome:  No Answer  Burman Nieves, CCMA Care Coordination Care Guide Direct Dial: [phone]

## 2023-08-21 NOTE — Progress Notes (Signed)
  Care Coordination  Outreach Note  08/21/2023 Name: Allison Gilbert MRN: [id number] DOB: [birth year]   Care Coordination Outreach Attempts: A second unsuccessful outreach was attempted today to offer the patient with information about available care coordination services.  Follow Up Plan:  Additional outreach attempts will be made to offer the patient care coordination information and services.   Encounter Outcome:  No Answer  Burman Nieves, CCMA Care Coordination Care Guide Direct Dial: [phone]

## 2023-08-22 NOTE — Progress Notes (Signed)
  Care Coordination   Note   08/22/2023 Name: Allison Gilbert MRN: [id number] DOB: 1956/06/10  Allison Gilbert is a 67 y.o. year old female who sees Olive Bass, FNP for primary care. I reached out to Dennard Schaumann by phone today to offer care coordination services.  Ms. Thurnau was given information about Care Coordination services today including:   The Care Coordination services include support from the care team which includes your Nurse Coordinator, Clinical Social Worker, or Pharmacist.  The Care Coordination team is here to help remove barriers to the health concerns and goals most important to you. Care Coordination services are voluntary, and the patient may decline or stop services at any time by request to their care team member.   Care Coordination Consent Status: Patient agreed to services and verbal consent obtained.   Follow up plan:  Telephone appointment with care coordination team member scheduled for:  08/26/2023  Encounter Outcome:  Patient Scheduled from referral   Burman Nieves, [facility] Care Coordination Care Guide Direct Dial: [phone]

## 2023-08-26 ENCOUNTER — Encounter: Payer: Medicare Other | Admitting: *Deleted

## 2023-08-26 ENCOUNTER — Ambulatory Visit: Payer: Self-pay | Admitting: *Deleted

## 2023-08-26 NOTE — Patient Outreach (Signed)
  Care Coordination   Initial Visit Note   08/26/2023 Name: BREELY CHANT MRN: [id number] DOB: 04/19/1956  STEPHANNIE KOBAYASHI is a 67 y.o. year old female who sees Olive Bass, FNP for primary care. I spoke with  Dennard Schaumann by phone today.  What matters to the patients health and wellness today?  Area Caregiver support groups    Goals Addressed             This Visit's Progress    caregiver support group options       Interventions Today    Flowsheet Row Most Recent Value  Chronic Disease   Chronic disease during today's visit Other  [caregiver stress]  General Interventions   General Interventions Discussed/Reviewed General Interventions Discussed, Community Resources  Mental Health Interventions   Mental Health Discussed/Reviewed Mental Health Discussed, Coping Strategies  [Pt encouraged to verbalize feelings regarding her caregiver responsibillities-patient provided with emotional support/reflective listening, coping strategies to manage stress discussed-patient agreeable to participating in a local caregiver support group]  Safety Interventions   Safety Discussed/Reviewed Safety Discussed              SDOH assessments and interventions completed:  Yes  SDOH Interventions Today    Flowsheet Row Most Recent Value  SDOH Interventions   Food Insecurity Interventions Intervention Not Indicated  Housing Interventions Intervention Not Indicated  Transportation Interventions Intervention Not Indicated  Physical Activity Interventions Intervention Not Indicated        Care Coordination Interventions:  Yes, provided   Follow up plan: Follow up call scheduled for 09/03/23    Encounter Outcome:  Patient Visit Completed

## 2023-08-27 NOTE — Patient Instructions (Signed)
Visit Information  Thank you for taking time to visit with me today. Please don't hesitate to contact me if I can be of assistance to you.   Following are the goals we discussed today:  CSW to provide patient with caregiver support group resources   Our next appointment is by telephone on 09/03/23 at 2pm  Please call the care guide team at [phone] if you need to cancel or reschedule your appointment.   If you are experiencing a Mental Health or Behavioral Health Crisis or need someone to talk to, please call 911   Patient verbalizes understanding of instructions and care plan provided today and agrees to view in MyChart. Active MyChart status and patient understanding of how to access instructions and care plan via MyChart confirmed with patient.     Telephone follow up appointment with care management team member scheduled for: 09/03/23  Verna Czech, LCSW [place]  [facility], [facility] Licensed Clinical Social Worker Care Coordinator  Direct Dial: [phone]

## 2023-08-27 NOTE — Patient Outreach (Signed)
  Care Coordination   Initial Visit Note   08/27/2023 Name: Allison Gilbert MRN: [id number] DOB: August 23, 1956  Allison Gilbert is a 67 y.o. year old female who sees Olive Bass, FNP for primary care. I spoke with  Dennard Schaumann by phone on 08/26/23  What matters to the patients health and wellness today?  Caregiver support resources.    Goals Addressed             This Visit's Progress    caregiver support group options       Interventions Today    Flowsheet Row Most Recent Value  Chronic Disease   Chronic disease during today's visit Other  [caregiver stress]  General Interventions   General Interventions Discussed/Reviewed General Interventions Discussed, Community Resources  Mental Health Interventions   Mental Health Discussed/Reviewed Mental Health Discussed, Coping Strategies  [Pt encouraged to verbalize feelings re: her caregiver resp.-pt provided with emotional support/reflective listening, coping strategies to manage stress discussed-self care emphasized, pt agreeable to support group options, declines resources for [facility] F/U]  Safety Interventions   Safety Discussed/Reviewed Safety Discussed              SDOH assessments and interventions completed:  Yes  SDOH Interventions Today    Flowsheet Row Most Recent Value  SDOH Interventions   Food Insecurity Interventions Intervention Not Indicated  Housing Interventions Intervention Not Indicated  Transportation Interventions Intervention Not Indicated  Physical Activity Interventions Intervention Not Indicated        Care Coordination Interventions:  Yes, provided   Follow up plan: Follow up call scheduled for 09/03/23    Encounter Outcome:  Patient Visit Completed

## 2023-08-28 ENCOUNTER — Encounter: Payer: Self-pay | Admitting: *Deleted

## 2023-08-28 NOTE — Patient Outreach (Addendum)
  Care Coordination   Collaboration   Visit Note   08/28/2023 Name: Allison Gilbert MRN: [id number] DOB: Jun 15, 1956  Allison Gilbert is a 67 y.o. year old female who sees Olive Bass, FNP for primary care. I  spoke with the [facility]is[facility] to confirm support group offerings  What matters to the patients health and wellness today?  Option for area support groups    Goals Addressed             This Visit's Progress    caregiver support group options       Interventions Today    Flowsheet Row Most Recent Value  Chronic Disease   Chronic disease during today's visit Other  [caregiver stress]  General Interventions   General Interventions Discussed/Reviewed Communication with  Communication with --  [[facility]-confirmed that they no longer have the group for parents with Autistic Adults-CSW referred to the [employer] [place]-confirmed virtual group held on 10/8 12-1pm-patient to be updated on options identified]              SDOH assessments and interventions completed:  No     Care Coordination Interventions:  Yes, provided   Follow up plan: Follow up call scheduled for 09/03/23    Encounter Outcome:  Patient Visit Completed

## 2023-09-03 ENCOUNTER — Encounter: Payer: Self-pay | Admitting: *Deleted

## 2023-09-03 ENCOUNTER — Telehealth: Payer: Self-pay | Admitting: *Deleted

## 2023-09-03 NOTE — Patient Outreach (Signed)
Care Coordination   Collaboration  Visit Note   09/03/2023 Name: Allison Gilbert MRN: [id number] DOB: [birth year]  Allison Gilbert is a 67 y.o. year old female who sees Olive Bass, FNP for primary care. I  contacted the [employer] [employer]  to confirm support group options   What matters to the patients health and wellness today?  Available area support group options for parents with children who manifest symptoms associated with Autism Spectrum Disorder.   Goals Addressed             This Visit's Progress    caregiver support group options       Interventions Today    Flowsheet Row Most Recent Value  General Interventions   General Interventions Discussed/Reviewed Communication with  Communication with PCP/Specialists  [[employer] contacted to confim suport group offerings for parents with children on the Autism Spectrum-group held virtually on Saturdays [phone]              SDOH assessments and interventions completed:  No     Care Coordination Interventions:  Yes, provided   Follow up plan: Follow up call scheduled for 09/05/23     Encounter Outcome:  Patient Visit Completed

## 2023-09-03 NOTE — Patient Outreach (Signed)
Care Coordination   09/03/2023 Name: Allison Gilbert MRN: [id number] DOB: 1956-04-22   Care Coordination Outreach Attempts:  An unsuccessful telephone outreach was attempted today to offer the patient information about available care coordination services.  Follow Up Plan:  Additional outreach attempts will be made to offer the patient care coordination information and services.   Encounter Outcome:  No Answer   Care Coordination Interventions:  No, not indicated    Guida Asman, LCSW [place]  [facility], [facility] Licensed Clinical Social Worker Care Coordinator  Direct Dial: [phone]

## 2023-09-05 ENCOUNTER — Ambulatory Visit: Payer: Self-pay | Admitting: *Deleted

## 2023-09-05 NOTE — Patient Instructions (Signed)
Visit Information  Thank you for taking time to visit with me today. Please don't hesitate to contact me if I can be of assistance to you.   Following are the goals we discussed today:  Please contact [employer] to follow up on support group offering [phone]   Please call the care guide team at [phone] if you need to cancel or reschedule your appointment.   If you are experiencing a Mental Health or Behavioral Health Crisis or need someone to talk to, please call 911   Patient verbalizes understanding of instructions and care plan provided today and agrees to view in MyChart. Active MyChart status and patient understanding of how to access instructions and care plan via MyChart confirmed with patient.     No further follow up required: patient to contact this Child psychotherapist with any additional community resource needs.  Allison Klingler, LCSW [name]  [facility], [facility] Licensed Clinical Social Worker Care Coordinator  Direct Dial: [phone]

## 2023-09-05 NOTE — Patient Outreach (Signed)
Care Coordination   Follow Up Visit Note   09/05/2023 Name: JOURDAN PASTERNAK MRN: [id number] DOB: 20-Jun-1956  KETURA SALATA is a 67 y.o. year old female who sees Olive Bass, FNP for primary care. I spoke with  Dennard Schaumann by phone today.  What matters to the patients health and wellness today?  Mental Health support related to caregiver responsibilities.    Goals Addressed             This Visit's Progress    caregiver support group options       Interventions Today    Flowsheet Row Most Recent Value  Chronic Disease   Chronic disease during today's visit Other  [caregiver stress]  General Interventions   General Interventions Discussed/Reviewed General Interventions Reviewed, Counselling psychologist contact provided for support group offering for parents with children on the Autism Spectrum [phone]  Mental Health Interventions   Mental Health Discussed/Reviewed Mental Health Reviewed, Coping Strategies, Depression  [emotional support/refelctive listening  provided related to caregiver resp. self care emphasized as well as follow up with support group]              SDOH assessments and interventions completed:  No     Care Coordination Interventions:  Yes, provided   Follow up plan: No further intervention required.   Encounter Outcome:  Patient Visit Completed

## 2023-09-15 ENCOUNTER — Other Ambulatory Visit: Payer: Self-pay | Admitting: Family

## 2023-09-15 DIAGNOSIS — Z1231 Encounter for screening mammogram for malignant neoplasm of breast: Secondary | ICD-10-CM

## 2023-10-02 ENCOUNTER — Ambulatory Visit
Admission: RE | Admit: 2023-10-02 | Discharge: 2023-10-02 | Disposition: A | Discharge status: Home / Self Care | Payer: Medicare Other | Source: Ambulatory Visit | Attending: Family | Admitting: Family

## 2023-10-02 DIAGNOSIS — Z1231 Encounter for screening mammogram for malignant neoplasm of breast: Secondary | ICD-10-CM | POA: Diagnosis not present

## 2023-10-07 ENCOUNTER — Other Ambulatory Visit: Payer: Self-pay | Admitting: Family

## 2023-10-07 DIAGNOSIS — R928 Other abnormal and inconclusive findings on diagnostic imaging of breast: Secondary | ICD-10-CM

## 2023-10-10 ENCOUNTER — Ambulatory Visit
Admission: RE | Admit: 2023-10-10 | Discharge: 2023-10-10 | Disposition: A | Discharge status: Home / Self Care | Payer: Medicare Other | Source: Ambulatory Visit | Attending: Family | Admitting: Family

## 2023-10-10 DIAGNOSIS — R921 Mammographic calcification found on diagnostic imaging of breast: Secondary | ICD-10-CM | POA: Diagnosis not present

## 2023-10-10 DIAGNOSIS — R928 Other abnormal and inconclusive findings on diagnostic imaging of breast: Secondary | ICD-10-CM

## 2023-10-14 ENCOUNTER — Other Ambulatory Visit: Payer: Self-pay | Admitting: Family

## 2023-10-14 DIAGNOSIS — R921 Mammographic calcification found on diagnostic imaging of breast: Secondary | ICD-10-CM

## 2023-10-20 DIAGNOSIS — H903 Sensorineural hearing loss, bilateral: Secondary | ICD-10-CM | POA: Diagnosis not present

## 2023-10-20 DIAGNOSIS — K111 Hypertrophy of salivary gland: Secondary | ICD-10-CM | POA: Diagnosis not present

## 2023-12-03 DIAGNOSIS — L309 Dermatitis, unspecified: Secondary | ICD-10-CM | POA: Diagnosis not present

## 2023-12-03 DIAGNOSIS — L821 Other seborrheic keratosis: Secondary | ICD-10-CM | POA: Diagnosis not present

## 2024-02-03 ENCOUNTER — Ambulatory Visit (INDEPENDENT_AMBULATORY_CARE_PROVIDER_SITE_OTHER): Payer: Medicare Other | Admitting: Family

## 2024-02-03 ENCOUNTER — Encounter: Payer: Self-pay | Admitting: Family

## 2024-02-03 ENCOUNTER — Other Ambulatory Visit (HOSPITAL_BASED_OUTPATIENT_CLINIC_OR_DEPARTMENT_OTHER): Payer: Self-pay

## 2024-02-03 DIAGNOSIS — L237 Allergic contact dermatitis due to plants, except food: Secondary | ICD-10-CM

## 2024-02-03 MED ORDER — PREDNISONE 10 MG PO TABS
ORAL_TABLET | ORAL | 0 refills | Status: AC
  Filled 2024-02-03: qty 18, 9d supply, fill #0

## 2024-02-03 MED ORDER — METHYLPREDNISOLONE ACETATE 40 MG/ML IJ SUSP
40.0000 mg | Freq: Once | INTRAMUSCULAR | Status: AC
  Administered 2024-02-03: 40 mg via INTRAMUSCULAR

## 2024-02-03 NOTE — Progress Notes (Signed)
 Allison Gilbert is a 68 y.o. female with the following history as recorded in EpicCare:  Patient Active Problem List   Diagnosis Date Noted   Allergic contact dermatitis due to plants, except food 08/22/2022   H/O cervical spine surgery 11/18/2018   S/P total hysterectomy and bilateral salpingo-oophorectomy 11/18/2018   GERD (gastroesophageal reflux disease) 10/23/2017   Adjustment disorder with mixed anxiety and depressed mood 09/25/2014   Insomnia 09/25/2014    Current Outpatient Medications  Medication Sig Dispense Refill   Calcium Carbonate-Vitamin D (CALCIUM 500 + D) 500-125 MG-UNIT TABS Take by mouth.     citalopram (CELEXA) 10 MG tablet Take 1 tablet (10 mg total) by mouth daily. 90 tablet 3   estradiol (ESTRACE) 0.5 MG tablet Take 1.5 tablets (0.75 mg total) by mouth daily. 135 tablet 3   LORazepam (ATIVAN) 1 MG tablet Take 1 tablet (1 mg total) by mouth at bedtime as needed for anxiety. 30 tablet 1   VITAMIN D PO Take 2,000 Units by mouth.     predniSONE (DELTASONE) 10 MG tablet Take 3 tablets (30 mg total) by mouth daily for 3 days, THEN 2 tablets (20 mg total) daily for 3 days, THEN 1 tablet (10 mg total) daily for 3 days. 18 tablet 0   No current facility-administered medications for this visit.    Allergies: Poison ivy extract and Codeine  No past medical history on file.  Past Surgical History:  Procedure Laterality Date   ABDOMINAL HYSTERECTOMY     TAH BSO-Due to abnormal pap   cage and plates in neck     plate in heel      Family History  Problem Relation Age of Onset   Stroke Maternal Grandmother     Social History   Tobacco Use   Smoking status: Never   Smokeless tobacco: Never  Substance Use Topics   Alcohol use: Yes    Alcohol/week: 7.0 standard drinks of alcohol    Types: 7 Glasses of wine per week    Comment: Occas    Subjective:   Seen with concerns for worsening rash; symptoms x 1 month; has already seen dermatology for symptoms and was given  topical symptoms; denies any new soaps, foods, detergents or medications. Notes that she has been working in the yard and is very sensitive to poison ivy/ poison oak- wondering if she may have accidentally gotten exposed;    Objective:  Vitals:   02/03/24 1321  BP: 122/76  Pulse: 74  SpO2: 99%  Weight: 142 lb 3.2 oz (64.5 kg)  Height: 5\' 7"  (1.702 m)    General: Well developed, well nourished, in no acute distress  Skin : Warm and dry. Erythematous macular rash noted on back; Head: Normocephalic and atraumatic  Lungs: Respirations unlabored;  Neurologic: Alert and oriented; speech intact; face symmetrical; moves all extremities well; CNII-XII intact without focal deficit   Assessment:  1. Allergic contact dermatitis due to plants, except food     Plan:  ? Possible exposure to plant irritant- patient has been doing more yard work recently; Depo-Medrol IM 40 mg given in office; she will start oral prednisone tomorrow; follow up worse, no better.   No follow-ups on file.  No orders of the defined types were placed in this encounter.   Requested Prescriptions   Signed Prescriptions Disp Refills   predniSONE (DELTASONE) 10 MG tablet 18 tablet 0    Sig: Take 3 tablets (30 mg total) by mouth daily for 3 days,  THEN 2 tablets (20 mg total) daily for 3 days, THEN 1 tablet (10 mg total) daily for 3 days.

## 2024-04-14 ENCOUNTER — Other Ambulatory Visit: Payer: Self-pay | Admitting: Family

## 2024-04-14 ENCOUNTER — Ambulatory Visit
Admission: RE | Admit: 2024-04-14 | Discharge: 2024-04-14 | Disposition: A | Discharge status: Home / Self Care | Payer: Medicare Other | Source: Ambulatory Visit | Attending: Family | Admitting: Family

## 2024-04-14 DIAGNOSIS — R921 Mammographic calcification found on diagnostic imaging of breast: Secondary | ICD-10-CM

## 2024-04-14 DIAGNOSIS — N6324 Unspecified lump in the left breast, lower inner quadrant: Secondary | ICD-10-CM | POA: Diagnosis not present

## 2024-04-16 ENCOUNTER — Ambulatory Visit
Admission: RE | Admit: 2024-04-16 | Discharge: 2024-04-16 | Disposition: A | Discharge status: Home / Self Care | Source: Ambulatory Visit | Attending: Family | Admitting: Family

## 2024-04-16 DIAGNOSIS — R920 Mammographic microcalcification found on diagnostic imaging of breast: Secondary | ICD-10-CM | POA: Diagnosis not present

## 2024-04-16 DIAGNOSIS — N6324 Unspecified lump in the left breast, lower inner quadrant: Secondary | ICD-10-CM | POA: Diagnosis not present

## 2024-04-16 DIAGNOSIS — D0512 Intraductal carcinoma in situ of left breast: Secondary | ICD-10-CM | POA: Diagnosis not present

## 2024-04-16 DIAGNOSIS — R921 Mammographic calcification found on diagnostic imaging of breast: Secondary | ICD-10-CM

## 2024-04-16 HISTORY — PX: BREAST BIOPSY: SHX20

## 2024-04-19 ENCOUNTER — Encounter: Payer: Self-pay | Admitting: *Deleted

## 2024-04-19 ENCOUNTER — Telehealth: Payer: Self-pay | Admitting: *Deleted

## 2024-04-19 DIAGNOSIS — D0512 Intraductal carcinoma in situ of left breast: Secondary | ICD-10-CM | POA: Insufficient documentation

## 2024-04-19 LAB — SURGICAL PATHOLOGY

## 2024-04-19 NOTE — Telephone Encounter (Signed)
 Confirmed [facility] for 04/28/24 at 1215pm.  Instructions and contact information given. Per pt request, mailed information packet

## 2024-04-28 ENCOUNTER — Ambulatory Visit: Payer: Self-pay | Admitting: General Surgery

## 2024-04-28 ENCOUNTER — Inpatient Hospital Stay (HOSPITAL_BASED_OUTPATIENT_CLINIC_OR_DEPARTMENT_OTHER): Admitting: Hematology

## 2024-04-28 ENCOUNTER — Inpatient Hospital Stay: Attending: Hematology

## 2024-04-28 ENCOUNTER — Telehealth: Payer: Self-pay | Admitting: Genetic Counselor

## 2024-04-28 ENCOUNTER — Ambulatory Visit: Admitting: Physical Therapy

## 2024-04-28 ENCOUNTER — Encounter: Payer: Self-pay | Admitting: General Practice

## 2024-04-28 ENCOUNTER — Encounter: Payer: Self-pay | Admitting: *Deleted

## 2024-04-28 ENCOUNTER — Ambulatory Visit
Admission: RE | Admit: 2024-04-28 | Discharge: 2024-04-28 | Disposition: A | Discharge status: Home / Self Care | Source: Ambulatory Visit | Attending: Radiation Oncology | Admitting: Radiation Oncology

## 2024-04-28 ENCOUNTER — Encounter: Payer: Self-pay | Admitting: Hematology

## 2024-04-28 VITALS — BP 110/62 | HR 72 | Resp 20 | Ht 67.0 in | Wt 140.6 lb

## 2024-04-28 DIAGNOSIS — M1990 Unspecified osteoarthritis, unspecified site: Secondary | ICD-10-CM | POA: Insufficient documentation

## 2024-04-28 DIAGNOSIS — Z90710 Acquired absence of both cervix and uterus: Secondary | ICD-10-CM | POA: Insufficient documentation

## 2024-04-28 DIAGNOSIS — D0512 Intraductal carcinoma in situ of left breast: Secondary | ICD-10-CM

## 2024-04-28 DIAGNOSIS — Z170 Estrogen receptor positive status [ER+]: Secondary | ICD-10-CM | POA: Insufficient documentation

## 2024-04-28 DIAGNOSIS — Z1721 Progesterone receptor positive status: Secondary | ICD-10-CM | POA: Insufficient documentation

## 2024-04-28 LAB — CMP (CANCER CENTER ONLY)
ALT: 14 U/L (ref 0–44)
AST: 18 U/L (ref 15–41)
Albumin: 4.2 g/dL (ref 3.5–5.0)
Alkaline Phosphatase: 48 U/L (ref 38–126)
Anion gap: 4 — ABNORMAL LOW (ref 5–15)
BUN: 16 mg/dL (ref 8–23)
CO2: 33 mmol/L — ABNORMAL HIGH (ref 22–32)
Calcium: 9.2 mg/dL (ref 8.9–10.3)
Chloride: 103 mmol/L (ref 98–111)
Creatinine: 0.66 mg/dL (ref 0.44–1.00)
GFR, Estimated: >60 mL/min (ref >60)
Glucose, Bld: 82 mg/dL (ref 70–99)
Potassium: 4.2 mmol/L (ref 3.5–5.1)
Sodium: 140 mmol/L (ref 135–145)
Total Bilirubin: 1 mg/dL (ref 0.0–1.2)
Total Protein: 6.6 g/dL (ref 6.5–8.1)

## 2024-04-28 LAB — CBC WITH DIFFERENTIAL (CANCER CENTER ONLY)
Abs Immature Granulocytes: 0.01 10*3/uL (ref 0.00–0.07)
Basophils Absolute: 0.1 10*3/uL (ref 0.0–0.1)
Basophils Relative: 1 %
Eosinophils Absolute: 0.2 10*3/uL (ref 0.0–0.5)
Eosinophils Relative: 3 %
HCT: 38.7 % (ref 36.0–46.0)
Hemoglobin: 12.7 g/dL (ref 12.0–15.0)
Immature Granulocytes: 0 %
Lymphocytes Relative: 31 %
Lymphs Abs: 1.8 10*3/uL (ref 0.7–4.0)
MCH: 31.3 pg (ref 26.0–34.0)
MCHC: 32.8 g/dL (ref 30.0–36.0)
MCV: 95.3 fL (ref 80.0–100.0)
Monocytes Absolute: 0.4 10*3/uL (ref 0.1–1.0)
Monocytes Relative: 7 %
Neutro Abs: 3.3 10*3/uL (ref 1.7–7.7)
Neutrophils Relative %: 58 %
Platelet Count: 177 10*3/uL (ref 150–400)
RBC: 4.06 MIL/uL (ref 3.87–5.11)
RDW: 13 % (ref 11.5–15.5)
WBC Count: 5.7 10*3/uL (ref 4.0–10.5)
nRBC: 0 % (ref 0.0–0.2)

## 2024-04-28 LAB — GENETIC SCREENING ORDER

## 2024-04-28 MED ORDER — KETOROLAC TROMETHAMINE 15 MG/ML IJ SOLN: 15.0000 mg | INTRAMUSCULAR | Status: AC

## 2024-04-28 NOTE — Progress Notes (Signed)
 [facility] Multidisciplinary Clinic Spiritual Care Note  Met with Allison Gilbert" in Breast Multidisciplinary Clinic to introduce Support Center team/resources.  She completed SDOH screening; results follow below.  SDOH Interventions    Flowsheet Row Care Coordination from 08/26/2023 in [facility] Community Care Coordination Clinical Support from 07/24/2022 in [facility] at [facility]  SDOH Interventions    Food Insecurity Interventions Intervention Not Indicated Intervention Not Indicated  Housing Interventions Intervention Not Indicated Intervention Not Indicated  Transportation Interventions Intervention Not Indicated Intervention Not Indicated  Financial Strain Interventions -- Intervention Not Indicated  Physical Activity Interventions Intervention Not Indicated Intervention Not Indicated  Social Connections Interventions -- Intervention Not Indicated       SDOH Screenings   Food Insecurity: No Food Insecurity (04/28/2024)  Housing: Low Risk  (04/28/2024)  Transportation Needs: No Transportation Needs (08/26/2023)  Utilities: Not At Risk (04/28/2024)  Alcohol Screen: Low Risk  (07/24/2022)  Depression (PHQ2-9): Low Risk  (02/03/2024)  Financial Resource Strain: Low Risk  (07/24/2022)  Physical Activity: Sufficiently Active (08/26/2023)  Social Connections: Moderately Isolated (07/24/2022)  Stress: Stress Concern Present (08/26/2023)  Tobacco Use: Low Risk  (04/28/2024)   Received from [facility] and patient discussed common feelings and emotions when being diagnosed with cancer, and the importance of support during treatment.  Chaplain informed patient of the support team and support services at [facility].  Chaplain provided contact information and encouraged patient to call with any questions or concerns.  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  Follow  up needed: Yes.  We plan to follow up by phone next week to speak in more detail.   [address] Allison Gilbert, [place], [facility] Pager [phone] Voicemail [phone]

## 2024-04-28 NOTE — Telephone Encounter (Signed)
 Allison Gilbert was seen by a genetic counselor during the breast multidisciplinary clinic on Apr 28, 2024. In addition to her personal history of breast cancer, she reported a family history of lung cancer. She does not meet NCCN criteria for genetic testing at this time.   She was still offered genetic counseling and testing but declined. We encourage her to contact us  if there are any changes to her personal or family history of cancer. If she meets NCCN criteria based on the updated personal/family history, she would be recommended to have genetic counseling and testing.

## 2024-04-28 NOTE — Progress Notes (Signed)
 Radiation Oncology         [phone] ________________________________  Multidisciplinary Breast Oncology Clinic [facility]) Initial Outpatient Consultation  Name: Allison Gilbert MRN: [id number]  Date: 04/28/2024  DOB: 1956-02-14  NG:[id number], Honora Lutes, FNP  Caralyn Chandler, MD   REFERRING PHYSICIAN: [name], MD  DIAGNOSIS: There were no encounter diagnoses.  Stage 0 (cTis (DCIS), cN0, cM0) Left Breast LIQ, Intermediate grade DCIS, ER+ / PR+ / Her2 not assessed  No diagnosis found.  HISTORY OF PRESENT ILLNESS::Allison Gilbert is a 68 y.o. female who is presenting to the office today for evaluation of her newly diagnosed breast cancer. She is accompanied by herself. She is doing well overall.   She initially presented for a routine screening mammography on 10/01/24 showing a possible abnormality in the left breast. She then underwent a left breast diagnostic mammography with [facility] on 10[facility]10[facility]23 which showed likely benign calcifications in the medial left breast.   She proceeded with follow up imaging of the left breast consisting of a left breast [facility] on 04/14/24 which showed indeterminate calcifications in the lower inner left breast measuring approximately 2.0 cm.   Biopsy of the left breast calcifications on 04/16/24 showed: intermediate grade DCIS measuring 4 mm in the greatest linear extent of the sample. Prognostic indicators significant for: estrogen receptor, 100% positive and progesterone receptor, 100% positive, both with strong staining intensity. Her2 not assessed.   Menarche: 68 years old Age at first live birth: 68 years old GP: 1 LMP: at the age of 68 Contraceptive: yes (did not indicated type and dates) HRT: did not indicate use on the provided form    The patient was referred today for presentation in the multidisciplinary conference.  Radiology studies and pathology slides were presented there for  review and discussion of treatment options.  A consensus was discussed regarding potential next steps.  PREVIOUS RADIATION THERAPY: No  PAST MEDICAL HISTORY: No past medical history on file.  PAST SURGICAL HISTORY: Past Surgical History:  Procedure Laterality Date   ABDOMINAL HYSTERECTOMY     TAH BSO-Due to abnormal pap   BREAST BIOPSY Left 04/16/2024   MM LT BREAST BX W LOC DEV 1ST LESION IMAGE BX SPEC STEREO GUIDE 04/16/2024 [facility] MAMMOGRAP[facility]   cage and plates in neck     plate in heel      FAMILY HISTORY:  Family History  Problem Relation Age of Onset   Stroke Maternal Grandmother     SOCIAL HISTORY:  Social History   Socioeconomic History   Marital status: Married    Spouse name: Not on file   Number of children: Not on file   Years of education: Not on file   Highest education level: Not on file  Occupational History   Not on file  Tobacco Use   Smoking status: Never   Smokeless tobacco: Never  Vaping Use   Vaping status: Never Used  Substance and Sexual Activity   Alcohol use: Yes    Alcohol/week: 7.0 standard drinks of alcohol    Types: 7 Glasses of wine per week    Comment: Occas   Drug use: Never   Sexual activity: Not Currently    Comment: 1st intercourse 19 yo-5 partners  Other Topics Concern   Not on file  Social History Narrative   Not on file   Social Drivers of Health   Financial Resource Strain: Low Risk  (07/24/2022)   Overall  Financial Resource Strain (CARDIA)    Difficulty of Paying Living Expenses: Not hard at all  Food Insecurity: No Food Insecurity (08/26/2023)   Hunger Vital Sign    Worried About Running Out of Food in the Last Year: Never true    Ran Out of Food in the Last Year: Never true  Transportation Needs: No Transportation Needs (08/26/2023)   PRAPARE - Administrator, Civil Service (Medical): No    Lack of Transportation (Non-Medical): No  Physical Activity: Sufficiently Active (08/26/2023)   Exercise Vital Sign     Days of Exercise per Week: 7 days    Minutes of Exercise per Session: 60 min  Stress: Stress Concern Present (08/26/2023)   [employer] of Occupational Health - Occupational Stress Questionnaire    Feeling of Stress : Rather much  Social Connections: Moderately Isolated (07/24/2022)   Social Connection and Isolation Panel [NHANES]    Frequency of Communication with Friends and Family: Once a week    Frequency of Social Gatherings with Friends and Family: Never    Attends Religious Services: More than 4 times per year    Active Member of [employer] or Organizations: No    Attends Engineer, structural: Never    Marital Status: Married    ALLERGIES:  Allergies  Allergen Reactions   Poison Ivy Extract    Codeine Nausea Only and Nausea And Vomiting    Vomiting "Many years ago"     MEDICATIONS:  Current Outpatient Medications  Medication Sig Dispense Refill   Calcium Carbonate-Vitamin D  (CALCIUM 500 + D) 500-125 MG-UNIT TABS Take by mouth.     citalopram  (CELEXA ) 10 MG tablet Take 1 tablet (10 mg total) by mouth daily. 90 tablet 3   estradiol  (ESTRACE ) 0.5 MG tablet Take 1.5 tablets (0.75 mg total) by mouth daily. 135 tablet 3   LORazepam  (ATIVAN ) 1 MG tablet Take 1 tablet (1 mg total) by mouth at bedtime as needed for anxiety. 30 tablet 1   VITAMIN D  PO Take 2,000 Units by mouth.     No current facility-administered medications for this encounter.    REVIEW OF SYSTEMS: A 10+ POINT REVIEW OF SYSTEMS WAS OBTAINED including neurology, dermatology, psychiatry, cardiac, respiratory, lymph, extremities, GI, GU, musculoskeletal, constitutional, reproductive, HEENT. She did not fill out the provided ROS form.    PHYSICAL EXAM:    04/28/2024  Vitals with BMI   Height 5\' 7"    Weight 140 lbs 10 oz   BMI 22.02   Systolic 110   Diastolic 62   Pulse 72    Lungs are clear to auscultation bilaterally. Heart has regular rate and rhythm. No palpable cervical, supraclavicular, or  axillary adenopathy. Abdomen soft, non-tender, normal bowel sounds. Breast: Right breast with no palpable mass, nipple discharge, or bleeding. Left breast with bruising in the medial aspect of the breast with biopsy site noted.    KPS = 100  100 - Normal; no complaints; no evidence of disease. 90   - Able to carry on normal activity; minor signs or symptoms of disease. 80   - Normal activity with effort; some signs or symptoms of disease. 64   - Cares for self; unable to carry on normal activity or to do active work. 60   - Requires occasional assistance, but is able to care for most of his personal needs. 50   - Requires considerable assistance and frequent medical care. 40   - Disabled; requires special care and assistance. 30   -  Severely disabled; hospital admission is indicated although death not imminent. 20   - Very sick; hospital admission necessary; active supportive treatment necessary. 10   - Moribund; fatal processes progressing rapidly. 0     - Dead  Karnofsky DA, Abelmann WH, Craver LS and Burchenal [facility] [phone]) The use of the nitrogen mustards in the palliative treatment of carcinoma: with particular reference to bronchogenic carcinoma Cancer 1 634-56  LABORATORY DATA:  Lab Results  Component Value Date   WBC 5.7 04/28/2024   HGB 12.7 04/28/2024   HCT 38.7 04/28/2024   MCV 95.3 04/28/2024   PLT 177 04/28/2024   Lab Results  Component Value Date   NA 140 04/28/2024   K 4.2 04/28/2024   CL 103 04/28/2024   CO2 33 (H) 04/28/2024   Lab Results  Component Value Date   ALT 14 04/28/2024   AST 18 04/28/2024   ALKPHOS 48 04/28/2024   BILITOT 1.0 04/28/2024    PULMONARY FUNCTION TEST:   Review Flowsheet        No data to display          RADIOGRAPHY: MM LT BREAST BX W LOC DEV 1ST LESION IMAGE BX SPEC STEREO GUIDE Addendum Date: 04/19/2024 ADDENDUM REPORT: 04/19/2024 13:14 ADDENDUM: Pathology revealed DUCTAL CARCINOMA IN SITU, CRIBRIFORM, INTERMEDIATE NUCLEAR  GRADE, NECROSIS: NOT IDENTIFIED, CALCIFICATIONS: PRESENT of the LEFT breast, inner midportion, 8:30-9 o'clock, (x clip). This was found to be concordant by Dr. Roda Cirri. Pathology results were discussed with the patient by telephone. The patient reported doing well after the biopsy with tenderness at the site. Post biopsy instructions and care were reviewed and questions were answered. The patient was encouraged to call [facility] for any additional concerns. My direct phone number was provided. The patient was referred to [facility] at [facility] on Apr 28, 2024. Pathology results reported by Kraig Peru, RN on 04/19/2024. Electronically Signed   By: Roda Cirri M.D.   On: 04/19/2024 13:14   Result Date: 04/19/2024 CLINICAL DATA:  Patient presents for stereotactic core needle biopsy of a 2 cm group of pleomorphic microcalcifications over the inner midportion of the left breast. EXAM: LEFT BREAST STEREOTACTIC CORE NEEDLE BIOPSY COMPARISON:  Previous exam(s). FINDINGS: The patient and I discussed the procedure of stereotactic-guided biopsy including benefits and alternatives. We discussed the high likelihood of a successful procedure. We discussed the risks of the procedure including infection, bleeding, tissue injury, clip migration, and inadequate sampling. Informed written consent was given. The usual time out protocol was performed immediately prior to the procedure. Using sterile technique and 1% Lidocaine as local anesthetic, under stereotactic guidance, a 9 gauge vacuum assisted device was used to perform core needle biopsy of the targeted microcalcifications over the inner midportion of the left breast using a superior to inferior approach. Specimen radiograph was performed showing multiple of the targeted microcalcifications. Specimens with calcifications are identified for pathology. Lesion quadrant: Left inner  lower quadrant (8:30-9 o'clock position). At the conclusion of the procedure, an X shaped tissue marker clip was deployed into the biopsy cavity. Follow-up 2-view mammogram was performed and dictated separately. IMPRESSION: Stereotactic-guided biopsy of suspicious left breast microcalcifications. No apparent complications. Electronically Signed: By: Roda Cirri M.D. On: 04/16/2024 11:57   MM CLIP PLACEMENT LEFT Result Date: 04/16/2024 CLINICAL DATA:  Patient is post stereotactic core needle biopsy of a 2 cm group of suspicious microcalcifications over the inner midportion of  the left breast. EXAM: 3D DIAGNOSTIC LEFT MAMMOGRAM POST STEREOTACTIC BIOPSY COMPARISON:  Previous exam(s). ACR Breast Density Category b: There are scattered areas of fibroglandular density. FINDINGS: 3D Mammographic images were obtained following stereotactic guided biopsy of the targeted microcalcifications over the inner midportion of the left breast. The biopsy marking clip is in expected position at the site of biopsy lying along the posterior edge of the microcalcifications. IMPRESSION: Appropriate positioning of the X shaped biopsy marking clip at the site of biopsy in the inner midportion of the left breast. Final Assessment: Post Procedure Mammograms for Marker Placement Electronically Signed   By: Roda Cirri M.D.   On: 04/16/2024 12:02   MM 3D DIAGNOSTIC MAMMOGRAM UNILATERAL LEFT BREAST Result Date: 04/14/2024 CLINICAL DATA:  Six-month follow-up of left breast calcifications. EXAM: DIGITAL DIAGNOSTIC UNILATERAL LEFT MAMMOGRAM WITH TOMOSYNTHESIS AND CAD TECHNIQUE: Left digital diagnostic mammography and breast tomosynthesis was performed. The images were evaluated with computer-aided detection. COMPARISON:  Previous exam(s). ACR Breast Density Category b: There are scattered areas of fibroglandular density. FINDINGS: Left: Mammogram: Previously seen group of calcifications in the lower inner left breast has increased in  number now spanning 2.0 cm. Some of the calcifications are coarse, heterogeneous and in a linear distribution. IMPRESSION: Indeterminate 2.0 cm group of left lower inner breast calcifications. RECOMMENDATION: Stereotactic guided biopsy of left lower inner breast calcifications. I have discussed the findings and recommendations with the patient. The biopsy procedure was explained to the patient and questions were answered. Patient expressed their understanding of the biopsy recommendation. Patient will be scheduled for biopsy at her earliest convenience by the schedulers. Ordering provider will be notified. If applicable, a reminder letter will be sent to the patient regarding the next appointment. BI-RADS CATEGORY  4: Suspicious. Electronically Signed   By: Elester Grim M.D.   On: 04/14/2024 14:07      IMPRESSION: Stage 0 (cTis (DCIS), cN0, cM0) Left Breast LIQ, Intermediate grade DCIS, ER+ / PR+ / Her2 not assessed  Patient will be a good candidate for breast conservation with radiotherapy to the left breast. We discussed the general course of radiation, potential side effects, and toxicities with radiation and the patient is interested in this approach. ***  PLAN:  Genetics  Left breast lumpectomy  Adjuvant radiation therapy  Adjuvant hormonal therapy    ------------------------------------------------  Noralee Beam, PhD, MD  This document serves as a record of services personally performed by Retta Caster, MD. It was created on his behalf by Aleta Anda, a trained medical scribe. The creation of this record is based on the scribe's personal observations and the provider's statements to them. This document has been checked and approved by the attending provider.

## 2024-04-29 ENCOUNTER — Encounter: Payer: Self-pay | Admitting: Hematology

## 2024-04-29 NOTE — Progress Notes (Signed)
 [facility]   [phone] [phone]   Clinic New Consult Note   Patient Care Team: Allison Alanis, FNP as PCP - General (Internal Medicine) Auther Bo, RN as Oncology Nurse Navigator Allison Hsu, RN as Oncology Nurse Navigator Allison [place], MD as Consulting Physician (Hematology) Allison Chandler, MD as Consulting Physician (General Surgery) Allison Caster, MD as Consulting Physician (Radiation Oncology) 04/29/2024  CHIEF COMPLAINTS/PURPOSE OF CONSULTATION:  Newly diagnosed left breast cancer   REFERRING PHYSICIAN:   Discussed the use of AI scribe software for clinical note transcription with the patient, who gave verbal consent to proceed.  History of Present Illness Allison ALA "Allison Gilbert" is a 68 year old female who presents to our multidisciplinary breast clinic for a new consult following a diagnosis of left breast cancer. She is accompanied by her mother, Allison Gilbert.  She was recently diagnosed with left breast cancer after a screening mammogram revealed increased size of the area of calcifications in the lower inner quadrant of left breast, from previously 1.2 cm to 2 cm now.  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 She had not undergone a mammogram for twelve years prior to the recent one.  Her past medical history includes thyroid  and gallbladder surgeries, as well as a hysterectomy. She experiences back pain and arthritis, which she manages with estradiol . She has been on estradiol  since menopause to alleviate body aches and improve sleep, taking a pill and a half daily. She reports some residual body aches but notes significant improvement with estradiol . No other systemic symptoms.  She denies any family history of cancer. Her social history includes drinking wine about three times a week and occasionally consuming a hard seltzer. She has never smoked or used recreational drugs. She is married and has  one son from a previous relationship.     MEDICAL HISTORY:  History reviewed. No pertinent past medical history.  SURGICAL HISTORY: Past Surgical History:  Procedure Laterality Date   ABDOMINAL HYSTERECTOMY     TAH BSO-Due to abnormal pap   BREAST BIOPSY Left 04/16/2024   MM LT BREAST BX W LOC DEV 1ST LESION IMAGE BX SPEC STEREO GUIDE 04/16/2024 [facility]   cage and plates in neck     plate in heel      SOCIAL HISTORY: Social History   Socioeconomic History   Marital status: Married    Spouse name: Not on file   Number of children: 1   Years of education: Not on file   Highest education level: Not on file  Occupational History   Not on file  Tobacco Use   Smoking status: Never   Smokeless tobacco: Never  Vaping Use   Vaping status: Never Used  Substance and Sexual Activity   Alcohol use: Yes    Alcohol/week: 3.0 standard drinks of alcohol    Types: 3 Glasses of wine per week   Drug use: Never   Sexual activity: Not Currently    Comment: 1st intercourse 19 yo-5 partners  Other Topics Concern   Not on file  Social History Narrative   Not on file   Social Drivers of Health   Financial Resource Strain: Low Risk  (07/24/2022)   Overall Financial Resource Strain (CARDIA)    Difficulty of Paying Living Expenses: Not hard at all  Food Insecurity: No Food Insecurity (04/28/2024)   Hunger Vital Sign    Worried About Running Out of Food in the  Last Year: Never true    Ran Out of Food in the Last Year: Never true  Transportation Needs: No Transportation Needs (08/26/2023)   PRAPARE - Administrator, Civil Service (Medical): No    Lack of Transportation (Non-Medical): No  Physical Activity: Sufficiently Active (08/26/2023)   Exercise Vital Sign    Days of Exercise per Week: 7 days    Minutes of Exercise per Session: 60 min  Stress: Stress Concern Present (08/26/2023)   [employer] of Occupational Health - Occupational Stress Questionnaire     Feeling of Stress : Rather much  Social Connections: Moderately Isolated (07/24/2022)   Social Connection and Isolation Panel [NHANES]    Frequency of Communication with Friends and Family: Once a week    Frequency of Social Gatherings with Friends and Family: Never    Attends Religious Services: More than 4 times per year    Active Member of [employer] or Organizations: No    Attends Banker Meetings: Never    Marital Status: Married  Catering manager Violence: Not At Risk (04/28/2024)   Humiliation, Afraid, Rape, and Kick questionnaire    Fear of Current or Ex-Partner: No    Emotionally Abused: No    Physically Abused: No    Sexually Abused: No    FAMILY HISTORY: Family History  Problem Relation Age of Onset   Stroke Maternal Grandmother     ALLERGIES:  is allergic to poison ivy extract and codeine.  MEDICATIONS:  Current Outpatient Medications  Medication Sig Dispense Refill   Calcium Carbonate-Vitamin D  (CALCIUM 500 + D) 500-125 MG-UNIT TABS Take by mouth.     citalopram  (CELEXA ) 10 MG tablet Take 1 tablet (10 mg total) by mouth daily. 90 tablet 3   estradiol  (ESTRACE ) 0.5 MG tablet Take 1.5 tablets (0.75 mg total) by mouth daily. 135 tablet 3   LORazepam  (ATIVAN ) 1 MG tablet Take 1 tablet (1 mg total) by mouth at bedtime as needed for anxiety. 30 tablet 1   VITAMIN D  PO Take 2,000 Units by mouth.     No current facility-administered medications for this visit.   Facility-Administered Medications Ordered in Other Visits  Medication Dose Route Frequency Provider Last Rate Last Admin   ketorolac (TORADOL) 15 MG/ML injection 15 mg  15 mg Intravenous On Call to OR Allison Chandler, MD        REVIEW OF SYSTEMS:   Constitutional: Denies fevers, chills or abnormal night sweats Eyes: Denies blurriness of vision, double vision or watery eyes Ears, nose, mouth, throat, and face: Denies mucositis or sore throat Respiratory: Denies cough, dyspnea or wheezes Cardiovascular:  Denies palpitation, chest discomfort or lower extremity swelling Gastrointestinal:  Denies nausea, heartburn or change in bowel habits Skin: Denies abnormal skin rashes Lymphatics: Denies new lymphadenopathy or easy bruising Neurological:Denies numbness, tingling or new weaknesses Behavioral/Psych: Mood is stable, no new changes  All other systems were reviewed with the patient and are negative.  PHYSICAL EXAMINATION: ECOG PERFORMANCE STATUS: 0 - Asymptomatic  Vitals:   04/28/24 1315  BP: 110/62  Pulse: 72  Resp: 20  SpO2: 93%   Filed Weights   04/28/24 1315  Weight: 140 lb 9.6 oz (63.8 kg)    GENERAL:alert, no distress and comfortable SKIN: skin color, texture, turgor are normal, no rashes or significant lesions EYES: normal, conjunctiva are pink and non-injected, sclera clear OROPHARYNX:no exudate, no erythema and lips, buccal mucosa, and tongue normal  NECK: supple, thyroid  normal size, non-tender, without nodularity  LYMPH:  no palpable lymphadenopathy in the cervical, axillary or inguinal LUNGS: clear to auscultation and percussion with normal breathing effort HEART: regular rate & rhythm and no murmurs and no lower extremity edema ABDOMEN:abdomen soft, non-tender and normal bowel sounds Musculoskeletal:no cyanosis of digits and no clubbing  PSYCH: alert & oriented x 3 with fluent speech NEURO: no focal motor/sensory deficits  Physical Exam BREAST: Bleeding hematoma in left breast at 2 o'clock position. Right breast normal. ABDOMEN: Normal.  LABORATORY DATA:  I have reviewed the data as listed    Latest Ref Rng & Units 04/28/2024   12:16 PM 08/19/2023    3:09 PM 04/28/2023    9:51 AM  CBC  WBC 4.0 - 10.5 K/uL 5.7  6.1  3.9   Hemoglobin 12.0 - 15.0 g/dL 16.1  09.6  04.5   Hematocrit 36.0 - 46.0 % 38.7  39.9  39.6   Platelets 150 - 400 K/uL 177  181.0  167.0     @cmpl @  RADIOGRAPHIC STUDIES: I have personally reviewed the radiological images as listed and agreed  with the findings in the report. MM LT BREAST BX W LOC DEV 1ST LESION IMAGE BX SPEC STEREO GUIDE Addendum Date: 04/19/2024 ADDENDUM REPORT: 04/19/2024 13:14 ADDENDUM: Pathology revealed DUCTAL CARCINOMA IN SITU, CRIBRIFORM, INTERMEDIATE NUCLEAR GRADE, NECROSIS: NOT IDENTIFIED, CALCIFICATIONS: PRESENT of the LEFT breast, inner midportion, 8:30-9 o'clock, (x clip). This was found to be concordant by Dr. Roda Cirri. Pathology results were discussed with the patient by telephone. The patient reported doing well after the biopsy with tenderness at the site. Post biopsy instructions and care were reviewed and questions were answered. The patient was encouraged to call [facility] for any additional concerns. My direct phone number was provided. The patient was referred to [facility] on Apr 28, 2024. Pathology results reported by Kraig Peru, RN on 04/19/2024. Electronically Signed   By: Roda Cirri M.D.   On: 04/19/2024 13:14   Result Date: 04/19/2024 CLINICAL DATA:  Patient presents for stereotactic core needle biopsy of a 2 cm group of pleomorphic microcalcifications over the inner midportion of the left breast. EXAM: LEFT BREAST STEREOTACTIC CORE NEEDLE BIOPSY COMPARISON:  Previous exam(s). FINDINGS: The patient and I discussed the procedure of stereotactic-guided biopsy including benefits and alternatives. We discussed the high likelihood of a successful procedure. We discussed the risks of the procedure including infection, bleeding, tissue injury, clip migration, and inadequate sampling. Informed written consent was given. The usual time out protocol was performed immediately prior to the procedure. Using sterile technique and 1% Lidocaine as local anesthetic, under stereotactic guidance, a 9 gauge vacuum assisted device was used to perform core needle biopsy of the targeted microcalcifications over the  inner midportion of the left breast using a superior to inferior approach. Specimen radiograph was performed showing multiple of the targeted microcalcifications. Specimens with calcifications are identified for pathology. Lesion quadrant: Left inner lower quadrant (8:30-9 o'clock position). At the conclusion of the procedure, an X shaped tissue marker clip was deployed into the biopsy cavity. Follow-up 2-view mammogram was performed and dictated separately. IMPRESSION: Stereotactic-guided biopsy of suspicious left breast microcalcifications. No apparent complications. Electronically Signed: By: Roda Cirri M.D. On: 04/16/2024 11:57   MM CLIP PLACEMENT LEFT Result Date: 04/16/2024 CLINICAL DATA:  Patient is post stereotactic core needle biopsy of a 2 cm group of suspicious microcalcifications over the inner midportion of the left breast.  EXAM: 3D DIAGNOSTIC LEFT MAMMOGRAM POST STEREOTACTIC BIOPSY COMPARISON:  Previous exam(s). ACR Breast Density Category b: There are scattered areas of fibroglandular density. FINDINGS: 3D Mammographic images were obtained following stereotactic guided biopsy of the targeted microcalcifications over the inner midportion of the left breast. The biopsy marking clip is in expected position at the site of biopsy lying along the posterior edge of the microcalcifications. IMPRESSION: Appropriate positioning of the X shaped biopsy marking clip at the site of biopsy in the inner midportion of the left breast. Final Assessment: Post Procedure Mammograms for Marker Placement Electronically Signed   By: Roda Cirri M.D.   On: 04/16/2024 12:02   MM 3D DIAGNOSTIC MAMMOGRAM UNILATERAL LEFT BREAST Result Date: 04/14/2024 CLINICAL DATA:  Six-month follow-up of left breast calcifications. EXAM: DIGITAL DIAGNOSTIC UNILATERAL LEFT MAMMOGRAM WITH TOMOSYNTHESIS AND CAD TECHNIQUE: Left digital diagnostic mammography and breast tomosynthesis was performed. The images were evaluated with  computer-aided detection. COMPARISON:  Previous exam(s). ACR Breast Density Category b: There are scattered areas of fibroglandular density. FINDINGS: Left: Mammogram: Previously seen group of calcifications in the lower inner left breast has increased in number now spanning 2.0 cm. Some of the calcifications are coarse, heterogeneous and in a linear distribution. IMPRESSION: Indeterminate 2.0 cm group of left lower inner breast calcifications. RECOMMENDATION: Stereotactic guided biopsy of left lower inner breast calcifications. I have discussed the findings and recommendations with the patient. The biopsy procedure was explained to the patient and questions were answered. Patient expressed their understanding of the biopsy recommendation. Patient will be scheduled for biopsy at her earliest convenience by the schedulers. Ordering provider will be notified. If applicable, a reminder letter will be sent to the patient regarding the next appointment. BI-RADS CATEGORY  4: Suspicious. Electronically Signed   By: Elester Grim M.D.   On: 04/14/2024 14:07    Assessment & Plan Ductal carcinoma in situ (DCIS) of left breast, ER+/PR+ Newly diagnosed DCIS of the left breast, detected on screening mammogram. The DCIS is non-invasive, ER/PR positive, and intermediate grade. The lesion is approximately 0.4 cm on biopsy, with a potential size of 2 cm based on mammogram.  There is no evidence of invasive cancer at this time. The condition is considered stage 0 breast cancer, which is highly curable.  - We discussed her future high risk of breast cancer, including invasive breast cancer, due to her current DCIS.  I used that the nomogram of DCIS from [facility], which showed her risk of future breast cancer is 12% over the next 10 years with surgery alone, which can be reduced with additional treatments. Radiation therapy can reduce the risk to 5% over 10 years, tamoxifen to 6%, and both to  2-3%. -She was seen by breast surgeon Dr. Alethea Andes today and plans to have lumpectomy soon. - She has been on hormonal replacement since menopause, and does not want to come off it. I recommend her considering tamoxifen therapy to block estrogen and reduce future breast cancer risk. - Discuss radiation therapy as an option to further reduce breast cancer risk. - Consult with gynecologist about potentially lowering estradiol  dose if opting for radiation alone. - Follow up after surgery and potentially after radiation therapy.  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 Estradiol  has been effective in managing pain and body aches associated with osteoarthritis. - Continue current estradiol  therapy for osteoarthritis pain management.  History of hysterectomy Hysterectomy at age 76.  Tamoxifen's risk of endometrial cancer is not applicable due to hysterectomy.  Plan - Imaging and biopsy results discussed with patient and her sister in detail - She will have a lumpectomy soon - Adjuvant tamoxifen and or adjuvant radiation are recommended she will decide if she wants to pursue both or just one of them -I will follow-up after surgery.   No orders of the defined types were placed in this encounter.   All questions were answered. The patient knows to call the clinic with any problems, questions or concerns. I spent 35 minutes counseling the patient face to face. The total time spent in the appointment was 45 minutes including review of chart and various tests results, discussions about plan of care and coordination of care plan.     Allison , MD 04/28/2024

## 2024-04-30 ENCOUNTER — Other Ambulatory Visit: Payer: Self-pay | Admitting: General Surgery

## 2024-04-30 DIAGNOSIS — D0512 Intraductal carcinoma in situ of left breast: Secondary | ICD-10-CM

## 2024-05-03 ENCOUNTER — Other Ambulatory Visit: Payer: Self-pay | Admitting: *Deleted

## 2024-05-03 ENCOUNTER — Encounter: Payer: Self-pay | Admitting: General Practice

## 2024-05-03 ENCOUNTER — Other Ambulatory Visit: Payer: Self-pay

## 2024-05-03 DIAGNOSIS — D0512 Intraductal carcinoma in situ of left breast: Secondary | ICD-10-CM

## 2024-05-03 NOTE — Progress Notes (Signed)
 [facility] Spiritual Care Note  Received return call from [facility] when she had a few minutes prior to picking her husband up from the hospital after his stay since Thursday due to pneumonia.  Malachi Screws was in good spirits overall, continues to use perspective and humor to cope, and plans to follow up with chaplain as needed/desired.   [address] Dorice Gardner, [place], [facility] Pager [phone] Voicemail [phone]

## 2024-05-03 NOTE — Progress Notes (Signed)
[facility] Spiritual Care Note  Attempted [facility] follow-up call, leaving voicemail with direct number and encouragement to return call.   Chaplain Jude Linck, MDiv, [facility] Pager [phone] Voicemail [phone] 

## 2024-05-04 ENCOUNTER — Telehealth: Payer: Self-pay | Admitting: Radiation Oncology

## 2024-05-04 NOTE — Telephone Encounter (Signed)
 Called patient to schedule a consultation w. Dr. Roselind Messier. No answer, LVM for a return call.

## 2024-05-04 NOTE — Telephone Encounter (Signed)
 Mailed patient [employer] Breath Hold document.

## 2024-05-06 ENCOUNTER — Telehealth: Payer: Self-pay | Admitting: *Deleted

## 2024-05-06 ENCOUNTER — Encounter: Payer: Self-pay | Admitting: *Deleted

## 2024-05-06 NOTE — Telephone Encounter (Signed)
 Spoke with patient to follow up from [facility] 5/14 and assess navigation needs. Reviewed appts. Encouraged her to call with any concerns or needs.

## 2024-05-07 ENCOUNTER — Encounter (HOSPITAL_BASED_OUTPATIENT_CLINIC_OR_DEPARTMENT_OTHER): Payer: Self-pay | Admitting: General Surgery

## 2024-05-07 ENCOUNTER — Other Ambulatory Visit: Payer: Self-pay

## 2024-05-11 ENCOUNTER — Telehealth: Payer: Self-pay | Admitting: Family

## 2024-05-11 NOTE — Telephone Encounter (Signed)
 Copied from CRM [id number]. Topic: Medicare AWV >> May 11, 2024  1:44 PM Juliana Ocean wrote: Reason for CRM: LVM 05/11/2024 to schedule AWV. Please schedule Virtual or Telehealth visits ONLY.   Allison Gilbert; Care Guide Ambulatory Clinical Support [place] [facility] Direct Dial: [phone]

## 2024-05-14 ENCOUNTER — Ambulatory Visit
Admission: RE | Admit: 2024-05-14 | Discharge: 2024-05-14 | Disposition: A | Discharge status: Home / Self Care | Source: Ambulatory Visit | Attending: General Surgery | Admitting: General Surgery

## 2024-05-14 DIAGNOSIS — D0512 Intraductal carcinoma in situ of left breast: Secondary | ICD-10-CM

## 2024-05-14 HISTORY — PX: BREAST BIOPSY: SHX20

## 2024-05-17 ENCOUNTER — Encounter (HOSPITAL_BASED_OUTPATIENT_CLINIC_OR_DEPARTMENT_OTHER)
Admission: RE | Disposition: A | Discharge status: Home / Self Care | Payer: Self-pay | Source: Home / Self Care | Attending: General Surgery

## 2024-05-17 ENCOUNTER — Ambulatory Visit (HOSPITAL_BASED_OUTPATIENT_CLINIC_OR_DEPARTMENT_OTHER): Admitting: Certified Registered"

## 2024-05-17 ENCOUNTER — Other Ambulatory Visit: Payer: Self-pay

## 2024-05-17 ENCOUNTER — Encounter (HOSPITAL_BASED_OUTPATIENT_CLINIC_OR_DEPARTMENT_OTHER): Payer: Self-pay | Admitting: General Surgery

## 2024-05-17 ENCOUNTER — Ambulatory Visit (HOSPITAL_BASED_OUTPATIENT_CLINIC_OR_DEPARTMENT_OTHER)
Admission: RE | Admit: 2024-05-17 | Discharge: 2024-05-17 | Disposition: A | Discharge status: Home / Self Care | Attending: General Surgery | Admitting: General Surgery

## 2024-05-17 ENCOUNTER — Ambulatory Visit
Admission: RE | Admit: 2024-05-17 | Discharge: 2024-05-17 | Disposition: A | Discharge status: Home / Self Care | Source: Ambulatory Visit | Attending: General Surgery | Admitting: General Surgery

## 2024-05-17 DIAGNOSIS — M1990 Unspecified osteoarthritis, unspecified site: Secondary | ICD-10-CM | POA: Diagnosis not present

## 2024-05-17 DIAGNOSIS — Z1721 Progesterone receptor positive status: Secondary | ICD-10-CM | POA: Diagnosis not present

## 2024-05-17 DIAGNOSIS — F32A Depression, unspecified: Secondary | ICD-10-CM | POA: Insufficient documentation

## 2024-05-17 DIAGNOSIS — Z170 Estrogen receptor positive status [ER+]: Secondary | ICD-10-CM | POA: Insufficient documentation

## 2024-05-17 DIAGNOSIS — D0512 Intraductal carcinoma in situ of left breast: Secondary | ICD-10-CM | POA: Insufficient documentation

## 2024-05-17 DIAGNOSIS — F419 Anxiety disorder, unspecified: Secondary | ICD-10-CM | POA: Insufficient documentation

## 2024-05-17 DIAGNOSIS — Z79899 Other long term (current) drug therapy: Secondary | ICD-10-CM | POA: Diagnosis not present

## 2024-05-17 DIAGNOSIS — Z01818 Encounter for other preprocedural examination: Secondary | ICD-10-CM

## 2024-05-17 DIAGNOSIS — K219 Gastro-esophageal reflux disease without esophagitis: Secondary | ICD-10-CM | POA: Diagnosis not present

## 2024-05-17 DIAGNOSIS — N641 Fat necrosis of breast: Secondary | ICD-10-CM | POA: Diagnosis not present

## 2024-05-17 HISTORY — DX: Malignant (primary) neoplasm, unspecified: C80.1

## 2024-05-17 HISTORY — DX: Unspecified osteoarthritis, unspecified site: M19.90

## 2024-05-17 HISTORY — DX: Depression, unspecified: F32.A

## 2024-05-17 HISTORY — PX: BREAST LUMPECTOMY WITH RADIOACTIVE SEED LOCALIZATION: SHX6424

## 2024-05-17 HISTORY — DX: Other complications of anesthesia, initial encounter: T88.59XA

## 2024-05-17 HISTORY — DX: Anxiety disorder, unspecified: F41.9

## 2024-05-17 SURGERY — Surgical Case
Anesthesia: *Unknown

## 2024-05-17 SURGERY — BREAST LUMPECTOMY WITH RADIOACTIVE SEED LOCALIZATION
Anesthesia: General | Site: Breast | Laterality: Left

## 2024-05-17 MED ORDER — ACETAMINOPHEN 500 MG PO TABS: 1000.0000 mg | ORAL_TABLET | Freq: Once | ORAL | Status: DC

## 2024-05-17 MED ORDER — ONDANSETRON HCL 4 MG/2ML IJ SOLN
INTRAMUSCULAR | Status: DC | PRN
  Administered 2024-05-17: 4 mg via INTRAVENOUS

## 2024-05-17 MED ORDER — EPHEDRINE 5 MG/ML INJ
INTRAVENOUS | Status: AC
  Filled 2024-05-17: qty 5

## 2024-05-17 MED ORDER — LACTATED RINGERS IV SOLN: INTRAVENOUS | Status: DC

## 2024-05-17 MED ORDER — FENTANYL CITRATE (PF) 100 MCG/2ML IJ SOLN
INTRAMUSCULAR | Status: AC
  Filled 2024-05-17: qty 2

## 2024-05-17 MED ORDER — MIDAZOLAM HCL 5 MG/5ML IJ SOLN
INTRAMUSCULAR | Status: DC | PRN
  Administered 2024-05-17: 2 mg via INTRAVENOUS

## 2024-05-17 MED ORDER — GABAPENTIN 100 MG PO CAPS
ORAL_CAPSULE | ORAL | Status: AC
  Filled 2024-05-17: qty 1

## 2024-05-17 MED ORDER — CHLORHEXIDINE GLUCONATE CLOTH 2 % EX PADS: 6.0000 | MEDICATED_PAD | Freq: Once | CUTANEOUS | Status: DC

## 2024-05-17 MED ORDER — ACETAMINOPHEN 500 MG PO TABS
1000.0000 mg | ORAL_TABLET | ORAL | Status: AC
  Administered 2024-05-17: 500 mg via ORAL

## 2024-05-17 MED ORDER — FENTANYL CITRATE (PF) 100 MCG/2ML IJ SOLN
INTRAMUSCULAR | Status: DC | PRN
Start: 2024-05-17 — End: 2024-05-17
  Administered 2024-05-17: 25 ug via INTRAVENOUS

## 2024-05-17 MED ORDER — ACETAMINOPHEN 500 MG PO TABS
ORAL_TABLET | ORAL | Status: AC
  Filled 2024-05-17: qty 2

## 2024-05-17 MED ORDER — TRAMADOL HCL 50 MG PO TABS: 50.0000 mg | ORAL_TABLET | Freq: Four times a day (QID) | ORAL | 0 refills | Status: DC | PRN

## 2024-05-17 MED ORDER — PROPOFOL 10 MG/ML IV BOLUS
INTRAVENOUS | Status: DC | PRN
  Administered 2024-05-17: 20 mg via INTRAVENOUS
  Administered 2024-05-17: 110 mg via INTRAVENOUS

## 2024-05-17 MED ORDER — CEFAZOLIN SODIUM-DEXTROSE 2-4 GM/100ML-% IV SOLN
INTRAVENOUS | Status: AC
  Filled 2024-05-17: qty 100

## 2024-05-17 MED ORDER — EPHEDRINE SULFATE (PRESSORS) 50 MG/ML IJ SOLN
INTRAMUSCULAR | Status: DC | PRN
  Administered 2024-05-17 (×3): 5 mg via INTRAVENOUS

## 2024-05-17 MED ORDER — BUPIVACAINE-EPINEPHRINE (PF) 0.25% -1:200000 IJ SOLN
INTRAMUSCULAR | Status: DC | PRN
  Administered 2024-05-17: 20 mL

## 2024-05-17 MED ORDER — CEFAZOLIN SODIUM-DEXTROSE 2-4 GM/100ML-% IV SOLN
2.0000 g | INTRAVENOUS | Status: AC
  Administered 2024-05-17: 2 g via INTRAVENOUS

## 2024-05-17 MED ORDER — GABAPENTIN 100 MG PO CAPS
100.0000 mg | ORAL_CAPSULE | ORAL | Status: AC
  Administered 2024-05-17: 100 mg via ORAL

## 2024-05-17 MED ORDER — LIDOCAINE HCL (CARDIAC) PF 100 MG/5ML IV SOSY
PREFILLED_SYRINGE | INTRAVENOUS | Status: DC | PRN
  Administered 2024-05-17: 100 mg via INTRAVENOUS

## 2024-05-17 MED ORDER — DEXAMETHASONE SODIUM PHOSPHATE 10 MG/ML IJ SOLN
INTRAMUSCULAR | Status: DC | PRN
  Administered 2024-05-17: 10 mg via INTRAVENOUS

## 2024-05-17 MED ORDER — MIDAZOLAM HCL 2 MG/2ML IJ SOLN
INTRAMUSCULAR | Status: AC
  Filled 2024-05-17: qty 2

## 2024-05-17 SURGICAL SUPPLY — 34 items
BLADE SURG 15 STRL LF DISP TIS (BLADE) ×1 IMPLANT
CANISTER SUC SOCK COL 7IN (MISCELLANEOUS) ×1 IMPLANT
CANISTER SUCT 1200ML W/VALVE (MISCELLANEOUS) ×1 IMPLANT
CHLORAPREP W/TINT 26 (MISCELLANEOUS) ×1 IMPLANT
CLIP APPLIE 9.375 MED OPEN (MISCELLANEOUS) IMPLANT
COVER BACK TABLE 60X90IN (DRAPES) ×1 IMPLANT
COVER MAYO STAND STRL (DRAPES) ×1 IMPLANT
COVER PROBE CYLINDRICAL 5X96 (MISCELLANEOUS) ×1 IMPLANT
DERMABOND ADVANCED .7 DNX12 (GAUZE/BANDAGES/DRESSINGS) ×1 IMPLANT
DRAPE LAPAROSCOPIC ABDOMINAL (DRAPES) ×1 IMPLANT
DRAPE UTILITY XL STRL (DRAPES) ×1 IMPLANT
ELECT COATED BLADE 2.86 ST (ELECTRODE) ×1 IMPLANT
ELECTRODE REM PT RTRN 9FT ADLT (ELECTROSURGICAL) ×1 IMPLANT
GLOVE BIO SURGEON STRL SZ7.5 (GLOVE) ×2 IMPLANT
GLOVE BIOGEL PI IND STRL 6.5 (GLOVE) IMPLANT
GLOVE SURG SS PI 6.5 STRL IVOR (GLOVE) IMPLANT
GOWN STRL REUS W/ TWL LRG LVL3 (GOWN DISPOSABLE) ×2 IMPLANT
KIT MARKER MARGIN INK (KITS) ×1 IMPLANT
NDL HYPO 25X1 1.5 SAFETY (NEEDLE) IMPLANT
NEEDLE HYPO 25X1 1.5 SAFETY (NEEDLE) ×1 IMPLANT
NS IRRIG 1000ML POUR BTL (IV SOLUTION) IMPLANT
PACK BASIN DAY SURGERY FS (CUSTOM PROCEDURE TRAY) ×1 IMPLANT
PENCIL SMOKE EVACUATOR (MISCELLANEOUS) ×1 IMPLANT
SLEEVE SCD COMPRESS KNEE MED (STOCKING) ×1 IMPLANT
SPIKE FLUID TRANSFER (MISCELLANEOUS) IMPLANT
SPONGE T-LAP 18X18 ~~LOC~~+RFID (SPONGE) ×1 IMPLANT
SUT MON AB 4-0 PC3 18 (SUTURE) ×1 IMPLANT
SUT SILK 2 0 SH (SUTURE) IMPLANT
SUT VICRYL 3-0 CR8 SH (SUTURE) ×1 IMPLANT
SYR CONTROL 10ML LL (SYRINGE) IMPLANT
TOWEL GREEN STERILE FF (TOWEL DISPOSABLE) ×1 IMPLANT
TRAY FAXITRON CT DISP (TRAY / TRAY PROCEDURE) ×1 IMPLANT
TUBE CONNECTING 20X1/4 (TUBING) ×1 IMPLANT
YANKAUER SUCT BULB TIP NO VENT (SUCTIONS) IMPLANT

## 2024-05-17 NOTE — H&P (Signed)
 REFERRING PHYSICIAN: Sonja [place], MD PROVIDER: Arlester Bence, MD MRN: [id number] DOB: Jan 12, 1956 Subjective    Chief Complaint: Breast Cancer  History of Present Illness: Allison Gilbert is a 68 y.o. female who is seen today as an office consultation for evaluation of Breast Cancer  We are asked to see the patient in consultation by Dr. Maryalice Smaller to evaluate her for a new left breast cancer. The patient is a 68 year old white female who recently went for a routine screening mammogram. At that time she was found to have 2 cm of calcification in the inner aspect of the left breast. This was biopsied and came back as intermediate grade ductal carcinoma in situ that was ER and PR positive. She does have problems with low blood sugar and waking up during surgery. She does not smoke. She has no family history of breast cancer.  Review of Systems: A complete review of systems was obtained from the patient. I have reviewed this information and discussed as appropriate with the patient. See HPI as well for other ROS.  ROS   Medical History: Past Medical History:  Diagnosis Date  Anxiety  Arthritis  History of cancer   Patient Active Problem List  Diagnosis  Ductal carcinoma in situ (DCIS) of left breast   Past Surgical History:  Procedure Laterality Date  .Left Breast Biopsy Left 04/16/2024  HYSTERECTOMY  Abdominal    Allergies  Allergen Reactions  Poison Ivy Extract Hives  Codeine Other (See Comments), Nausea and Nausea And Vomiting  Other reaction(s): Nausea Only, Vomiting "Many years ago"  Other reaction(s): Nausea Only Vomiting "Many years ago"  Vomiting "Many years ago"   Current Outpatient Medications on File Prior to Visit  Medication Sig Dispense Refill  citalopram  (CELEXA ) 10 MG tablet Take 1 tablet by mouth once daily  estradioL  (ESTRACE ) 0.5 MG tablet Take 1.5 tablets by mouth once daily  LORazepam  (ATIVAN ) 1 MG tablet Take 1 tablet by mouth at bedtime as needed   calcium carbonate-vitamin D3 500 mg-3.125 mcg (125 unit) per tablet Take 1 tablet by mouth once daily  citalopram  (CELEXA ) 20 MG tablet Take 1 tablet by mouth once daily   No current facility-administered medications on file prior to visit.   Family History  Problem Relation Age of Onset  Hyperlipidemia (Elevated cholesterol) Brother    Social History   Tobacco Use  Smoking Status Never  Smokeless Tobacco Never    Social History   Socioeconomic History  Marital status: Married  Tobacco Use  Smoking status: Never  Smokeless tobacco: Never  Vaping Use  Vaping status: Never Used  Substance and Sexual Activity  Alcohol use: Yes  Comment: 3 times a week  Drug use: Never   Social Drivers of Corporate investment banker Strain: Low Risk (07/24/2022)  Received from [facility]  Overall Financial Resource Strain (CARDIA)  Difficulty of Paying Living Expenses: Not hard at all  Food Insecurity: No Food Insecurity (08/26/2023)  Received from [facility]  Hunger Vital Sign  Worried About Running Out of Food in the Last Year: Never true  Ran Out of Food in the Last Year: Never true  Transportation Needs: No Transportation Needs (08/26/2023)  Received from [facility] - Transportation  Lack of Transportation (Medical): No  Lack of Transportation (Non-Medical): No  Physical Activity: Sufficiently Active (08/26/2023)  Received from [facility]  Exercise Vital Sign  Days of Exercise per Week: 7 days  Minutes of Exercise per Session: 60 min  Stress:  Stress Concern Present (08/26/2023)  Received from [facility] of Occupational Health - Occupational Stress Questionnaire  Feeling of Stress : Rather much  Social Connections: Moderately Isolated (07/24/2022)  Received from [facility]  Social Connection and Isolation Panel [NHANES]  Frequency of Communication with Friends and Family: Once a week  Frequency of Social Gatherings with Friends and Family: Never   Attends Religious Services: More than 4 times per year  Active Member of [employer] or Organizations: No  Attends Banker Meetings: Never  Marital Status: Married   Objective:  There were no vitals filed for this visit.  There is no height or weight on file to calculate BMI.  Physical Exam Vitals reviewed.  Constitutional:  General: She is not in acute distress. Appearance: Normal appearance.  HENT:  Head: Normocephalic and atraumatic.  Right Ear: External ear normal.  Left Ear: External ear normal.  Nose: Nose normal.  Mouth/Throat:  Mouth: Mucous membranes are moist.  Pharynx: Oropharynx is clear.  Eyes:  General: No scleral icterus. Extraocular Movements: Extraocular movements intact.  Conjunctiva/sclera: Conjunctivae normal.  Pupils: Pupils are equal, round, and reactive to light.  Cardiovascular:  Rate and Rhythm: Normal rate and regular rhythm.  Pulses: Normal pulses.  Heart sounds: Normal heart sounds.  Pulmonary:  Effort: Pulmonary effort is normal. No respiratory distress.  Breath sounds: Normal breath sounds.  Abdominal:  General: Bowel sounds are normal.  Palpations: Abdomen is soft.  Tenderness: There is no abdominal tenderness.  Musculoskeletal:  General: No swelling, tenderness or deformity. Normal range of motion.  Cervical back: Normal range of motion and neck supple.  Skin: General: Skin is warm and dry.  Coloration: Skin is not jaundiced.  Neurological:  General: No focal deficit present.  Mental Status: She is alert and oriented to person, place, and time.  Psychiatric:  Mood and Affect: Mood normal.  Behavior: Behavior normal.     Breast: There is no palpable mass in either breast. There is no palpable axillary, supraclavicular, or cervical lymphadenopathy. She does have a bruise in the upper inner quadrant of the left breast  Labs, Imaging and Diagnostic Testing:  Assessment and Plan:   Diagnoses and all orders for this  visit:  Ductal carcinoma in situ (DCIS) of left breast - [facility] Case Posting Request; Future   The patient appears to have a 2 cm area of ductal carcinoma in situ in the inner aspect of the left breast. I have discussed with her in detail the different options for treatment and at this point she favors breast conservation which I feel is very reasonable. She will not need a node evaluation. I have discussed with her in detail the risks and benefits of the operation as well as some of the technical aspects including use of a radioactive seed for localization and she understands and wishes to proceed. We will move forward with surgical scheduling. She will meet with medical and radiation oncology to discuss adjuvant therapy as well.

## 2024-05-17 NOTE — Anesthesia Procedure Notes (Signed)
 Procedure Name: LMA Insertion Date/Time: 05/17/2024 8:28 AM  Performed by: Noralyn Beams, CRNAPre-anesthesia Checklist: Patient identified, Emergency Drugs available, Suction available and Patient being monitored Patient Re-evaluated:Patient Re-evaluated prior to induction Oxygen Delivery Method: Circle system utilized Preoxygenation: Pre-oxygenation with 100% oxygen Induction Type: IV induction Ventilation: Mask ventilation without difficulty LMA: LMA inserted LMA Size: 4.0 Number of attempts: 1 Airway Equipment and Method: Bite block Placement Confirmation: positive ETCO2 Tube secured with: Tape Dental Injury: Teeth and Oropharynx as per pre-operative assessment

## 2024-05-17 NOTE — Discharge Instructions (Signed)
No Tylenol before 1:45pm today.  Post Anesthesia Home Care Instructions  Activity: Get plenty of rest for the remainder of the day. A responsible individual must stay with you for 24 hours following the procedure.  For the next 24 hours, DO NOT: -Drive a car -Paediatric nurse -Drink alcoholic beverages -Take any medication unless instructed by your physician -Make any legal decisions or sign important papers.  Meals: Start with liquid foods such as gelatin or soup. Progress to regular foods as tolerated. Avoid greasy, spicy, heavy foods. If nausea and/or vomiting occur, drink only clear liquids until the nausea and/or vomiting subsides. Call your physician if vomiting continues.  Special Instructions/Symptoms: Your throat may feel dry or sore from the anesthesia or the breathing tube placed in your throat during surgery. If this causes discomfort, gargle with warm salt water. The discomfort should disappear within 24 hours.  If you had a scopolamine patch placed behind your ear for the management of post- operative nausea and/or vomiting:  1. The medication in the patch is effective for 72 hours, after which it should be removed.  Wrap patch in a tissue and discard in the trash. Wash hands thoroughly with soap and water. 2. You may remove the patch earlier than 72 hours if you experience unpleasant side effects which may include dry mouth, dizziness or visual disturbances. 3. Avoid touching the patch. Wash your hands with soap and water after contact with the patch.

## 2024-05-17 NOTE — Anesthesia Preprocedure Evaluation (Addendum)
 Anesthesia Evaluation  Patient identified by MRN, date of birth, ID band Patient awake    Reviewed: Allergy & Precautions, NPO status , Patient's Chart, lab work & pertinent test results  History of Anesthesia Complications Negative for: history of anesthetic complications  Airway Mallampati: II  TM Distance: >3 FB Neck ROM: Full    Dental no notable dental hx.    Pulmonary neg pulmonary ROS   Pulmonary exam normal        Cardiovascular negative cardio ROS Normal cardiovascular exam     Neuro/Psych   Anxiety Depression    H/O cervical spine surgery    GI/Hepatic Neg liver ROS,GERD  ,,  Endo/Other  negative endocrine ROS    Renal/GU negative Renal ROS     Musculoskeletal  (+) Arthritis ,    Abdominal   Peds  Hematology negative hematology ROS (+)   Anesthesia Other Findings Ductal carcinoma in situ of left breast  Reproductive/Obstetrics                             Anesthesia Physical Anesthesia Plan  ASA: 2  Anesthesia Plan: General   Post-op Pain Management: Tylenol PO (pre-op)*   Induction: Intravenous  PONV Risk Score and Plan: 3 and Treatment may vary due to age or medical condition, Ondansetron, Dexamethasone and Midazolam  Airway Management Planned: LMA  Additional Equipment: None  Intra-op Plan:   Post-operative Plan: Extubation in OR  Informed Consent: I have reviewed the patients History and Physical, chart, labs and discussed the procedure including the risks, benefits and alternatives for the proposed anesthesia with the patient or authorized representative who has indicated his/her understanding and acceptance.     Dental advisory given  Plan Discussed with: CRNA  Anesthesia Plan Comments:        Anesthesia Quick Evaluation

## 2024-05-17 NOTE — Anesthesia Postprocedure Evaluation (Signed)
 Anesthesia Post Note  Patient: Allison Gilbert  Procedure(s) Performed: BREAST LUMPECTOMY WITH RADIOACTIVE SEED LOCALIZATION (Left: Breast)     Patient location during evaluation: PACU Anesthesia Type: General Level of consciousness: awake and alert Pain management: pain level controlled Vital Signs Assessment: post-procedure vital signs reviewed and stable Respiratory status: spontaneous breathing, nonlabored ventilation and respiratory function stable Cardiovascular status: blood pressure returned to baseline Postop Assessment: no apparent nausea or vomiting Anesthetic complications: no   No notable events documented.  Last Vitals:  Vitals:   05/17/24 0930 05/17/24 0941  BP: 121/70 120/75  Pulse: 89 86  Resp: 12 17  Temp:    SpO2: 99% 97%    Last Pain:  Vitals:   05/17/24 0941  TempSrc:   PainSc: 0-No pain                 Rayfield Cairo

## 2024-05-17 NOTE — Transfer of Care (Signed)
 Immediate Anesthesia Transfer of Care Note  Patient: Allison Gilbert  Procedure(s) Performed: BREAST LUMPECTOMY WITH RADIOACTIVE SEED LOCALIZATION (Left: Breast)  Patient Location: PACU  Anesthesia Type:General  Level of Consciousness: drowsy  Airway & Oxygen Therapy: Patient Spontanous Breathing and Patient connected to face mask oxygen  Post-op Assessment: Report given to RN and Post -op Vital signs reviewed and stable  Post vital signs: Reviewed and stable  Last Vitals:  Vitals Value Taken Time  BP 117/66 (80)   Temp    Pulse 97 05/17/24 0918  Resp 12 05/17/24 0918  SpO2 100 % 05/17/24 0918  Vitals shown include unfiled device data.  Last Pain:  Vitals:   05/17/24 0732  TempSrc: Temporal  PainSc: 2       Patients Stated Pain Goal: 5 (05/17/24 0732)  Complications: No notable events documented.

## 2024-05-17 NOTE — Interval H&P Note (Signed)
 History and Physical Interval Note:  05/17/2024 7:45 AM  Allison Gilbert  has presented today for surgery, with the diagnosis of LEFT BREAST DCIS.  The various methods of treatment have been discussed with the patient and family. After consideration of risks, benefits and other options for treatment, the patient has consented to  Procedure(s) with comments: BREAST LUMPECTOMY WITH RADIOACTIVE SEED LOCALIZATION (Left) - LEFT BREAST RADIOACTIVE SEED LOCALIZED LUMPECTOMY as a surgical intervention.  The patient's history has been reviewed, patient examined, no change in status, stable for surgery.  I have reviewed the patient's chart and labs.  Questions were answered to the patient's satisfaction.     [name]

## 2024-05-17 NOTE — Op Note (Signed)
 05/17/2024  9:10 AM  PATIENT:  Allison Gilbert  68 y.o. female  PRE-OPERATIVE DIAGNOSIS:  LEFT BREAST DCIS  POST-OPERATIVE DIAGNOSIS:  LEFT BREAST DCIS  PROCEDURE:  Procedure(s) with comments: LEFT BREAST RADIOACTIVE SEED LOCALIZED LUMPECTOMY  SURGEON:  Surgeons and Role:    Caralyn Chandler, MD - Primary  PHYSICIAN ASSISTANT:   ASSISTANTS: none   ANESTHESIA:   local and general  EBL:  10 mL   BLOOD ADMINISTERED:none  DRAINS: none   LOCAL MEDICATIONS USED:  MARCAINE     SPECIMEN:  Source of Specimen:  left breast tissue with additional anterior and deep margin  DISPOSITION OF SPECIMEN:  PATHOLOGY  COUNTS:  YES  TOURNIQUET:  * No tourniquets in log *  DICTATION: .Dragon Dictation  After informed consent was obtained the patient was brought to the operating room and placed in the supine position on the operating table.  After adequate induction of general anesthesia the patient's left breast was prepped with ChloraPrep, allowed to dry, and draped in usual sterile manner.  An appropriate timeout was performed.  Previously an I-125 seed was placed in the inner central left breast to mark an area of ductal carcinoma in situ.  The neoprobe was set to I-125 in the area of radioactivity was readily identified.  The area around this was infiltrated with quarter percent Marcaine.  A curvilinear incision was made along the upper inner edge of the areola of the left breast with a 15 blade knife.  The incision was carried through the skin and subcutaneous tissue sharply with the electrocautery.  The seed was very close to the edge of the areola.  At this point dissection was carried around the radioactive seed while checking the area of radioactivity frequently.  Once the tissue was removed it was oriented with the appropriate paint colors.  A specimen radiograph was obtained that showed the clip and seed to be near the center of the specimen.  I did elect to take an additional anterior and  posterior margin and these were marked appropriately.  All of the tissue was then sent to pathology for further evaluation.  Hemostasis was achieved using the Bovie electrocautery.  The wound was irrigated with saline and infiltrated with more quarter percent Marcaine.  The cavity was marked with clips.  The deep layer of the wound was then closed with layers of interrupted 3-0 Vicryl stitches.  The skin was then closed with interrupted 4-0 Monocryl subcuticular stitches.  Dermabond dressings were applied.  The patient tolerated the procedure well.  At the end of the case all needle sponge and instrument counts were correct.  The patient was then awakened and taken recovery in stable condition. PLAN OF CARE: Discharge to home after PACU  PATIENT DISPOSITION:  PACU - hemodynamically stable.   Delay start of Pharmacological VTE agent (>24hrs) due to surgical blood loss or risk of bleeding: not applicable

## 2024-05-18 ENCOUNTER — Encounter (HOSPITAL_BASED_OUTPATIENT_CLINIC_OR_DEPARTMENT_OTHER): Payer: Self-pay | Admitting: General Surgery

## 2024-05-19 ENCOUNTER — Ambulatory Visit: Payer: Self-pay | Admitting: General Surgery

## 2024-05-19 LAB — SURGICAL PATHOLOGY

## 2024-05-20 ENCOUNTER — Encounter: Payer: Self-pay | Admitting: *Deleted

## 2024-06-08 DIAGNOSIS — L570 Actinic keratosis: Secondary | ICD-10-CM | POA: Diagnosis not present

## 2024-06-08 DIAGNOSIS — Z85828 Personal history of other malignant neoplasm of skin: Secondary | ICD-10-CM | POA: Diagnosis not present

## 2024-06-08 DIAGNOSIS — L821 Other seborrheic keratosis: Secondary | ICD-10-CM | POA: Diagnosis not present

## 2024-06-08 DIAGNOSIS — L814 Other melanin hyperpigmentation: Secondary | ICD-10-CM | POA: Diagnosis not present

## 2024-06-08 DIAGNOSIS — L820 Inflamed seborrheic keratosis: Secondary | ICD-10-CM | POA: Diagnosis not present

## 2024-06-08 DIAGNOSIS — L309 Dermatitis, unspecified: Secondary | ICD-10-CM | POA: Diagnosis not present

## 2024-06-09 NOTE — Progress Notes (Addendum)
 Location of Breast Cancer::Ductal carcinoma in situ (DCIS) of left breast   Histology per Pathology Report:   Receptor Status: ER( 100%, strong staining), PR ( (100%, strong staining ), Her2-neu (), Ki-67()  Did patient present with symptoms (if so, please note symptoms) or was this found on screening mammography?: Screening mammogram  Past/Anticipated interventions by surgeon, if any:  Left BREAST LUMPECTOMY WITH RADIOACTIVE SEED LOCALIZATION with Curvin Deward MOULD, MD 05/17/24 Past/Anticipated interventions by medical oncology, if any:   Lymphedema issues, if any:  Denies  Pain issues, if any:  Denies  SAFETY ISSUES: Prior radiation?Denies Pacemaker/ICD? Denies Possible current pregnancy?Denies Is the patient on methotrexate? Denies  Current Complaints / other details:       BP 113/74 (BP Location: Left Arm, Patient Position: Sitting, Cuff Size: Normal)   Pulse 80   Temp 97.8 F (36.6 C)   Resp 18   Ht 5' 7 (1.702 m)   Wt 141 lb 12.8 oz (64.3 kg)   SpO2 99%   BMI 22.21 kg/m

## 2024-06-10 ENCOUNTER — Other Ambulatory Visit: Payer: Self-pay

## 2024-06-11 ENCOUNTER — Other Ambulatory Visit: Payer: Self-pay

## 2024-06-13 NOTE — Progress Notes (Signed)
 Radiation Oncology         [phone] ________________________________  Name: Allison Gilbert MRN: [id number]  Date: 06/14/2024  DOB: 14-Sep-1956  Re-Evaluation Note  CC: Jason Leita Repine, FNP  Lanny Callander, MD  No diagnosis found.  Diagnosis:   Stage 0 (cTis (DCIS), cN0, cM0) Left Breast LIQ, High-grade DCIS, ER+ / PR+ / Her2 not assessed: s/p left breast lumpectomy   Narrative:  The patient returns today to discuss radiation treatment options. She was seen in the multidisciplinary breast clinic on 04/28/24.   Since her consultation date, she opted to proceed with a left breast lumpectomy without nodal biopsies on 05/17/24 under the care of Dr. Curvin. Pathology from the procedure revealed: tumor the size of 0.7 x 0.6 x 0.6 cm; histology of high-grade DCIS with focal necrosis (along with fibrocystic changes including stromal fibrosis and UDH); all margins negative for in situ disease; margin status to in situ disease of 13 mm from the final posterior margin. Prognostic indicators significant for: estrogen receptor 100% positive and progesterone receptor 100% positive, both with strong staining intensity; Her2 not assessed.    She will return to Dr. Lanny in the near future to further discuss antiestrogen treatment options.   On review of systems, the patient reports ***. She denies *** and any other symptoms.    Allergies:  is allergic to poison ivy extract and codeine.  Meds: Current Outpatient Medications  Medication Sig Dispense Refill   Calcium Carbonate-Vitamin D  (CALCIUM 500 + D) 500-125 MG-UNIT TABS Take by mouth.     citalopram  (CELEXA ) 10 MG tablet Take 1 tablet (10 mg total) by mouth daily. (Patient taking differently: Take 5 mg by mouth daily.) 90 tablet 3   estradiol  (ESTRACE ) 0.5 MG tablet Take 1.5 tablets (0.75 mg total) by mouth daily. 135 tablet 3   LORazepam  (ATIVAN ) 1 MG tablet Take 1 tablet (1 mg total) by mouth at bedtime as needed for anxiety. 30 tablet 1    traMADol  (ULTRAM ) 50 MG tablet Take 1 tablet (50 mg total) by mouth every 6 (six) hours as needed. 20 tablet 0   VITAMIN D  PO Take 2,000 Units by mouth.     No current facility-administered medications for this encounter.    Physical Findings: The patient is in no acute distress. Patient is alert and oriented.  vitals were not taken for this visit.  No significant changes. Lungs are clear to auscultation bilaterally. Heart has regular rate and rhythm. No palpable cervical, supraclavicular, or axillary adenopathy. Abdomen soft, non-tender, normal bowel sounds. Right Breast: no palpable mass, nipple discharge or bleeding. Left Breast: ***  Lab Findings: Lab Results  Component Value Date   WBC 5.7 04/28/2024   HGB 12.7 04/28/2024   HCT 38.7 04/28/2024   MCV 95.3 04/28/2024   PLT 177 04/28/2024    Radiographic Findings: MM Breast Surgical Specimen Result Date: 05/17/2024 CLINICAL DATA:  Evaluate surgical specimen following lumpectomy for LEFT breast cancer. EXAM: SPECIMEN RADIOGRAPH OF THE LEFT BREAST COMPARISON:  Previous exam(s). FINDINGS: Status post excision of the LEFT breast. The radioactive seed and X biopsy clip are present and intact. IMPRESSION: Specimen radiograph of the LEFT breast. Electronically Signed   By: Reyes Phi M.D.   On: 05/17/2024 16:41    Impression: Stage 0 (cTis (DCIS), cN0, cM0) Left Breast LIQ, High-grade DCIS, ER+ / PR+ / Her2 not assessed: s/p left breast lumpectomy   ***  Plan:  Patient is scheduled for CT simulation {date/later today}. ***  -----------------------------------  Lynwood CHARM Nasuti, PhD, MD  This document serves as a record of services personally performed by Lynwood Nasuti, MD. It was created on his behalf by Dorthy Fuse, a trained medical scribe. The creation of this record is based on the scribe's personal observations and the provider's statements to them. This document has been checked and approved by the attending provider.

## 2024-06-14 ENCOUNTER — Ambulatory Visit
Admission: RE | Admit: 2024-06-14 | Discharge: 2024-06-14 | Disposition: A | Discharge status: Home / Self Care | Source: Ambulatory Visit | Attending: Radiation Oncology | Admitting: Radiation Oncology

## 2024-06-14 ENCOUNTER — Encounter: Payer: Self-pay | Admitting: Radiation Oncology

## 2024-06-14 VITALS — BP 113/74 | HR 80 | Temp 97.8°F | Resp 18 | Ht 67.0 in | Wt 141.8 lb

## 2024-06-14 DIAGNOSIS — D0512 Intraductal carcinoma in situ of left breast: Secondary | ICD-10-CM | POA: Diagnosis present

## 2024-06-14 DIAGNOSIS — Z170 Estrogen receptor positive status [ER+]: Secondary | ICD-10-CM | POA: Insufficient documentation

## 2024-06-14 DIAGNOSIS — Z79899 Other long term (current) drug therapy: Secondary | ICD-10-CM | POA: Insufficient documentation

## 2024-06-15 ENCOUNTER — Other Ambulatory Visit: Payer: Self-pay

## 2024-06-21 ENCOUNTER — Telehealth: Payer: Self-pay | Admitting: *Deleted

## 2024-06-21 ENCOUNTER — Encounter: Payer: Self-pay | Admitting: *Deleted

## 2024-06-21 DIAGNOSIS — Z170 Estrogen receptor positive status [ER+]: Secondary | ICD-10-CM | POA: Diagnosis not present

## 2024-06-21 DIAGNOSIS — D0512 Intraductal carcinoma in situ of left breast: Secondary | ICD-10-CM

## 2024-06-21 NOTE — Telephone Encounter (Signed)
 Error

## 2024-06-22 ENCOUNTER — Other Ambulatory Visit: Payer: Self-pay

## 2024-06-22 ENCOUNTER — Telehealth: Payer: Self-pay | Admitting: Hematology

## 2024-06-22 NOTE — Telephone Encounter (Signed)
 Rescheule appointment per staff messages  Called left VM with changes made to the upcoming appointment.

## 2024-06-28 ENCOUNTER — Other Ambulatory Visit: Payer: Self-pay

## 2024-06-28 ENCOUNTER — Ambulatory Visit
Admission: RE | Admit: 2024-06-28 | Discharge: 2024-06-28 | Disposition: A | Discharge status: Home / Self Care | Source: Ambulatory Visit | Attending: Radiation Oncology | Admitting: Radiation Oncology

## 2024-06-28 DIAGNOSIS — Z170 Estrogen receptor positive status [ER+]: Secondary | ICD-10-CM | POA: Diagnosis not present

## 2024-06-28 DIAGNOSIS — D0512 Intraductal carcinoma in situ of left breast: Secondary | ICD-10-CM

## 2024-06-28 DIAGNOSIS — Z510 Encounter for antineoplastic radiation therapy: Secondary | ICD-10-CM | POA: Diagnosis not present

## 2024-06-28 LAB — RAD ONC ARIA SESSION SUMMARY
Course Elapsed Days: 0
Plan Fractions Treated to Date: 1
Plan Prescribed Dose Per Fraction: 2.67 Gy
Plan Total Fractions Prescribed: 16
Plan Total Prescribed Dose: 42.72 Gy
Reference Point Dosage Given to Date: 2.67 Gy
Reference Point Session Dosage Given: 2.67 Gy
Session Number: 1

## 2024-06-29 ENCOUNTER — Other Ambulatory Visit: Payer: Self-pay

## 2024-06-29 ENCOUNTER — Ambulatory Visit
Admission: RE | Admit: 2024-06-29 | Discharge: 2024-06-29 | Disposition: A | Discharge status: Home / Self Care | Source: Ambulatory Visit | Attending: Radiation Oncology | Admitting: Radiation Oncology

## 2024-06-29 DIAGNOSIS — Z510 Encounter for antineoplastic radiation therapy: Secondary | ICD-10-CM | POA: Diagnosis not present

## 2024-06-29 DIAGNOSIS — D0512 Intraductal carcinoma in situ of left breast: Secondary | ICD-10-CM | POA: Diagnosis not present

## 2024-06-29 DIAGNOSIS — Z170 Estrogen receptor positive status [ER+]: Secondary | ICD-10-CM | POA: Diagnosis not present

## 2024-06-29 LAB — RAD ONC ARIA SESSION SUMMARY
Course Elapsed Days: 1
Plan Fractions Treated to Date: 2
Plan Prescribed Dose Per Fraction: 2.67 Gy
Plan Total Fractions Prescribed: 16
Plan Total Prescribed Dose: 42.72 Gy
Reference Point Dosage Given to Date: 5.34 Gy
Reference Point Session Dosage Given: 2.67 Gy
Session Number: 2

## 2024-06-29 MED ORDER — RADIAPLEXRX EX GEL: Freq: Once | CUTANEOUS | Status: AC

## 2024-06-29 MED ORDER — ALRA NON-METALLIC DEODORANT (RAD-ONC)
1.0000 | Freq: Once | TOPICAL | Status: AC
  Administered 2024-06-29: 1 via TOPICAL

## 2024-06-30 ENCOUNTER — Ambulatory Visit
Admission: RE | Admit: 2024-06-30 | Discharge: 2024-06-30 | Disposition: A | Discharge status: Home / Self Care | Source: Ambulatory Visit | Attending: Radiation Oncology | Admitting: Radiation Oncology

## 2024-06-30 ENCOUNTER — Other Ambulatory Visit: Payer: Self-pay

## 2024-06-30 DIAGNOSIS — Z170 Estrogen receptor positive status [ER+]: Secondary | ICD-10-CM | POA: Diagnosis not present

## 2024-06-30 DIAGNOSIS — D0512 Intraductal carcinoma in situ of left breast: Secondary | ICD-10-CM | POA: Diagnosis not present

## 2024-06-30 DIAGNOSIS — Z510 Encounter for antineoplastic radiation therapy: Secondary | ICD-10-CM | POA: Diagnosis not present

## 2024-06-30 LAB — RAD ONC ARIA SESSION SUMMARY
Course Elapsed Days: 2
Plan Fractions Treated to Date: 3
Plan Prescribed Dose Per Fraction: 2.67 Gy
Plan Total Fractions Prescribed: 16
Plan Total Prescribed Dose: 42.72 Gy
Reference Point Dosage Given to Date: 8.01 Gy
Reference Point Session Dosage Given: 2.67 Gy
Session Number: 3

## 2024-07-01 ENCOUNTER — Other Ambulatory Visit: Payer: Self-pay

## 2024-07-01 ENCOUNTER — Ambulatory Visit
Admission: RE | Admit: 2024-07-01 | Discharge: 2024-07-01 | Disposition: A | Discharge status: Home / Self Care | Source: Ambulatory Visit | Attending: Radiation Oncology | Admitting: Radiation Oncology

## 2024-07-01 DIAGNOSIS — D0512 Intraductal carcinoma in situ of left breast: Secondary | ICD-10-CM | POA: Diagnosis not present

## 2024-07-01 DIAGNOSIS — Z510 Encounter for antineoplastic radiation therapy: Secondary | ICD-10-CM | POA: Diagnosis not present

## 2024-07-01 DIAGNOSIS — Z170 Estrogen receptor positive status [ER+]: Secondary | ICD-10-CM | POA: Diagnosis not present

## 2024-07-01 LAB — RAD ONC ARIA SESSION SUMMARY
Course Elapsed Days: 3
Plan Fractions Treated to Date: 4
Plan Prescribed Dose Per Fraction: 2.67 Gy
Plan Total Fractions Prescribed: 16
Plan Total Prescribed Dose: 42.72 Gy
Reference Point Dosage Given to Date: 10.68 Gy
Reference Point Session Dosage Given: 2.67 Gy
Session Number: 4

## 2024-07-02 ENCOUNTER — Other Ambulatory Visit: Payer: Self-pay

## 2024-07-02 ENCOUNTER — Ambulatory Visit
Admission: RE | Admit: 2024-07-02 | Discharge: 2024-07-02 | Disposition: A | Discharge status: Home / Self Care | Source: Ambulatory Visit | Attending: Radiation Oncology | Admitting: Radiation Oncology

## 2024-07-02 DIAGNOSIS — Z170 Estrogen receptor positive status [ER+]: Secondary | ICD-10-CM | POA: Diagnosis not present

## 2024-07-02 DIAGNOSIS — Z510 Encounter for antineoplastic radiation therapy: Secondary | ICD-10-CM | POA: Diagnosis not present

## 2024-07-02 DIAGNOSIS — D0512 Intraductal carcinoma in situ of left breast: Secondary | ICD-10-CM | POA: Diagnosis not present

## 2024-07-02 LAB — RAD ONC ARIA SESSION SUMMARY
Course Elapsed Days: 4
Plan Fractions Treated to Date: 5
Plan Prescribed Dose Per Fraction: 2.67 Gy
Plan Total Fractions Prescribed: 16
Plan Total Prescribed Dose: 42.72 Gy
Reference Point Dosage Given to Date: 13.35 Gy
Reference Point Session Dosage Given: 2.67 Gy
Session Number: 5

## 2024-07-05 ENCOUNTER — Other Ambulatory Visit: Payer: Self-pay

## 2024-07-05 ENCOUNTER — Ambulatory Visit: Admitting: Radiation Oncology

## 2024-07-05 ENCOUNTER — Ambulatory Visit
Admission: RE | Admit: 2024-07-05 | Discharge: 2024-07-05 | Disposition: A | Discharge status: Home / Self Care | Source: Ambulatory Visit | Attending: Radiation Oncology | Admitting: Radiation Oncology

## 2024-07-05 DIAGNOSIS — Z510 Encounter for antineoplastic radiation therapy: Secondary | ICD-10-CM | POA: Diagnosis not present

## 2024-07-05 DIAGNOSIS — Z170 Estrogen receptor positive status [ER+]: Secondary | ICD-10-CM | POA: Diagnosis not present

## 2024-07-05 DIAGNOSIS — D0512 Intraductal carcinoma in situ of left breast: Secondary | ICD-10-CM | POA: Diagnosis not present

## 2024-07-05 LAB — RAD ONC ARIA SESSION SUMMARY
Course Elapsed Days: 7
Plan Fractions Treated to Date: 6
Plan Prescribed Dose Per Fraction: 2.67 Gy
Plan Total Fractions Prescribed: 16
Plan Total Prescribed Dose: 42.72 Gy
Reference Point Dosage Given to Date: 16.02 Gy
Reference Point Session Dosage Given: 2.67 Gy
Session Number: 6

## 2024-07-06 ENCOUNTER — Ambulatory Visit

## 2024-07-06 ENCOUNTER — Other Ambulatory Visit: Payer: Self-pay

## 2024-07-06 ENCOUNTER — Ambulatory Visit
Admission: RE | Admit: 2024-07-06 | Discharge: 2024-07-06 | Disposition: A | Discharge status: Home / Self Care | Source: Ambulatory Visit | Attending: Radiation Oncology | Admitting: Radiation Oncology

## 2024-07-06 DIAGNOSIS — Z510 Encounter for antineoplastic radiation therapy: Secondary | ICD-10-CM | POA: Diagnosis not present

## 2024-07-06 DIAGNOSIS — D0512 Intraductal carcinoma in situ of left breast: Secondary | ICD-10-CM | POA: Diagnosis not present

## 2024-07-06 DIAGNOSIS — Z170 Estrogen receptor positive status [ER+]: Secondary | ICD-10-CM | POA: Diagnosis not present

## 2024-07-06 LAB — RAD ONC ARIA SESSION SUMMARY
Course Elapsed Days: 8
Plan Fractions Treated to Date: 7
Plan Prescribed Dose Per Fraction: 2.67 Gy
Plan Total Fractions Prescribed: 16
Plan Total Prescribed Dose: 42.72 Gy
Reference Point Dosage Given to Date: 18.69 Gy
Reference Point Session Dosage Given: 2.67 Gy
Session Number: 7

## 2024-07-07 ENCOUNTER — Other Ambulatory Visit: Payer: Self-pay

## 2024-07-07 ENCOUNTER — Ambulatory Visit
Admission: RE | Admit: 2024-07-07 | Discharge: 2024-07-07 | Disposition: A | Discharge status: Home / Self Care | Source: Ambulatory Visit | Attending: Radiation Oncology | Admitting: Radiation Oncology

## 2024-07-07 ENCOUNTER — Ambulatory Visit

## 2024-07-07 DIAGNOSIS — Z510 Encounter for antineoplastic radiation therapy: Secondary | ICD-10-CM | POA: Diagnosis not present

## 2024-07-07 DIAGNOSIS — D0512 Intraductal carcinoma in situ of left breast: Secondary | ICD-10-CM | POA: Diagnosis not present

## 2024-07-07 DIAGNOSIS — Z170 Estrogen receptor positive status [ER+]: Secondary | ICD-10-CM | POA: Diagnosis not present

## 2024-07-07 LAB — RAD ONC ARIA SESSION SUMMARY
Course Elapsed Days: 9
Plan Fractions Treated to Date: 8
Plan Prescribed Dose Per Fraction: 2.67 Gy
Plan Total Fractions Prescribed: 16
Plan Total Prescribed Dose: 42.72 Gy
Reference Point Dosage Given to Date: 21.36 Gy
Reference Point Session Dosage Given: 2.67 Gy
Session Number: 8

## 2024-07-08 ENCOUNTER — Ambulatory Visit

## 2024-07-08 ENCOUNTER — Other Ambulatory Visit: Payer: Self-pay

## 2024-07-08 ENCOUNTER — Ambulatory Visit
Admission: RE | Admit: 2024-07-08 | Discharge: 2024-07-08 | Disposition: A | Discharge status: Home / Self Care | Source: Ambulatory Visit | Attending: Radiation Oncology | Admitting: Radiation Oncology

## 2024-07-08 DIAGNOSIS — D0512 Intraductal carcinoma in situ of left breast: Secondary | ICD-10-CM | POA: Diagnosis not present

## 2024-07-08 DIAGNOSIS — Z510 Encounter for antineoplastic radiation therapy: Secondary | ICD-10-CM | POA: Diagnosis not present

## 2024-07-08 DIAGNOSIS — Z170 Estrogen receptor positive status [ER+]: Secondary | ICD-10-CM | POA: Diagnosis not present

## 2024-07-08 LAB — RAD ONC ARIA SESSION SUMMARY
Course Elapsed Days: 10
Plan Fractions Treated to Date: 9
Plan Prescribed Dose Per Fraction: 2.67 Gy
Plan Total Fractions Prescribed: 16
Plan Total Prescribed Dose: 42.72 Gy
Reference Point Dosage Given to Date: 24.03 Gy
Reference Point Session Dosage Given: 2.67 Gy
Session Number: 9

## 2024-07-09 ENCOUNTER — Other Ambulatory Visit: Payer: Self-pay

## 2024-07-09 ENCOUNTER — Ambulatory Visit

## 2024-07-09 ENCOUNTER — Ambulatory Visit
Admission: RE | Admit: 2024-07-09 | Discharge: 2024-07-09 | Disposition: A | Discharge status: Home / Self Care | Source: Ambulatory Visit | Attending: Radiation Oncology | Admitting: Radiation Oncology

## 2024-07-09 DIAGNOSIS — Z170 Estrogen receptor positive status [ER+]: Secondary | ICD-10-CM | POA: Diagnosis not present

## 2024-07-09 DIAGNOSIS — D0512 Intraductal carcinoma in situ of left breast: Secondary | ICD-10-CM | POA: Diagnosis not present

## 2024-07-09 DIAGNOSIS — Z510 Encounter for antineoplastic radiation therapy: Secondary | ICD-10-CM | POA: Diagnosis not present

## 2024-07-09 LAB — RAD ONC ARIA SESSION SUMMARY
Course Elapsed Days: 11
Plan Fractions Treated to Date: 10
Plan Prescribed Dose Per Fraction: 2.67 Gy
Plan Total Fractions Prescribed: 16
Plan Total Prescribed Dose: 42.72 Gy
Reference Point Dosage Given to Date: 26.7 Gy
Reference Point Session Dosage Given: 2.67 Gy
Session Number: 10

## 2024-07-12 ENCOUNTER — Ambulatory Visit

## 2024-07-12 ENCOUNTER — Other Ambulatory Visit: Payer: Self-pay

## 2024-07-12 ENCOUNTER — Ambulatory Visit
Admission: RE | Admit: 2024-07-12 | Discharge: 2024-07-12 | Disposition: A | Discharge status: Home / Self Care | Source: Ambulatory Visit | Attending: Radiation Oncology | Admitting: Radiation Oncology

## 2024-07-12 DIAGNOSIS — Z510 Encounter for antineoplastic radiation therapy: Secondary | ICD-10-CM | POA: Diagnosis not present

## 2024-07-12 DIAGNOSIS — D0512 Intraductal carcinoma in situ of left breast: Secondary | ICD-10-CM | POA: Diagnosis not present

## 2024-07-12 DIAGNOSIS — Z170 Estrogen receptor positive status [ER+]: Secondary | ICD-10-CM | POA: Diagnosis not present

## 2024-07-12 LAB — RAD ONC ARIA SESSION SUMMARY
Course Elapsed Days: 14
Plan Fractions Treated to Date: 11
Plan Prescribed Dose Per Fraction: 2.67 Gy
Plan Total Fractions Prescribed: 16
Plan Total Prescribed Dose: 42.72 Gy
Reference Point Dosage Given to Date: 29.37 Gy
Reference Point Session Dosage Given: 2.67 Gy
Session Number: 11

## 2024-07-13 ENCOUNTER — Ambulatory Visit
Admission: RE | Admit: 2024-07-13 | Discharge: 2024-07-13 | Disposition: A | Discharge status: Home / Self Care | Source: Ambulatory Visit | Attending: Radiation Oncology | Admitting: Radiation Oncology

## 2024-07-13 ENCOUNTER — Ambulatory Visit

## 2024-07-13 ENCOUNTER — Other Ambulatory Visit: Payer: Self-pay

## 2024-07-13 DIAGNOSIS — Z170 Estrogen receptor positive status [ER+]: Secondary | ICD-10-CM | POA: Diagnosis not present

## 2024-07-13 DIAGNOSIS — D0512 Intraductal carcinoma in situ of left breast: Secondary | ICD-10-CM | POA: Diagnosis not present

## 2024-07-13 DIAGNOSIS — Z510 Encounter for antineoplastic radiation therapy: Secondary | ICD-10-CM | POA: Diagnosis not present

## 2024-07-13 LAB — RAD ONC ARIA SESSION SUMMARY
Course Elapsed Days: 15
Plan Fractions Treated to Date: 12
Plan Prescribed Dose Per Fraction: 2.67 Gy
Plan Total Fractions Prescribed: 16
Plan Total Prescribed Dose: 42.72 Gy
Reference Point Dosage Given to Date: 32.04 Gy
Reference Point Session Dosage Given: 2.67 Gy
Session Number: 12

## 2024-07-14 ENCOUNTER — Other Ambulatory Visit: Payer: Self-pay

## 2024-07-14 ENCOUNTER — Ambulatory Visit

## 2024-07-14 ENCOUNTER — Ambulatory Visit
Admission: RE | Admit: 2024-07-14 | Discharge: 2024-07-14 | Disposition: A | Discharge status: Home / Self Care | Source: Ambulatory Visit | Attending: Radiation Oncology | Admitting: Radiation Oncology

## 2024-07-14 DIAGNOSIS — Z510 Encounter for antineoplastic radiation therapy: Secondary | ICD-10-CM | POA: Diagnosis not present

## 2024-07-14 DIAGNOSIS — Z170 Estrogen receptor positive status [ER+]: Secondary | ICD-10-CM | POA: Diagnosis not present

## 2024-07-14 DIAGNOSIS — D0512 Intraductal carcinoma in situ of left breast: Secondary | ICD-10-CM | POA: Diagnosis not present

## 2024-07-14 LAB — RAD ONC ARIA SESSION SUMMARY
Course Elapsed Days: 16
Plan Fractions Treated to Date: 13
Plan Prescribed Dose Per Fraction: 2.67 Gy
Plan Total Fractions Prescribed: 16
Plan Total Prescribed Dose: 42.72 Gy
Reference Point Dosage Given to Date: 34.71 Gy
Reference Point Session Dosage Given: 2.67 Gy
Session Number: 13

## 2024-07-15 ENCOUNTER — Ambulatory Visit

## 2024-07-15 ENCOUNTER — Other Ambulatory Visit: Payer: Self-pay

## 2024-07-15 ENCOUNTER — Ambulatory Visit
Admission: RE | Admit: 2024-07-15 | Discharge: 2024-07-15 | Disposition: A | Discharge status: Home / Self Care | Source: Ambulatory Visit | Attending: Radiation Oncology | Admitting: Radiation Oncology

## 2024-07-15 DIAGNOSIS — Z510 Encounter for antineoplastic radiation therapy: Secondary | ICD-10-CM | POA: Diagnosis not present

## 2024-07-15 DIAGNOSIS — Z170 Estrogen receptor positive status [ER+]: Secondary | ICD-10-CM | POA: Diagnosis not present

## 2024-07-15 DIAGNOSIS — D0512 Intraductal carcinoma in situ of left breast: Secondary | ICD-10-CM | POA: Diagnosis not present

## 2024-07-15 LAB — RAD ONC ARIA SESSION SUMMARY
Course Elapsed Days: 17
Plan Fractions Treated to Date: 14
Plan Prescribed Dose Per Fraction: 2.67 Gy
Plan Total Fractions Prescribed: 16
Plan Total Prescribed Dose: 42.72 Gy
Reference Point Dosage Given to Date: 37.38 Gy
Reference Point Session Dosage Given: 2.67 Gy
Session Number: 14

## 2024-07-16 ENCOUNTER — Ambulatory Visit
Admission: RE | Admit: 2024-07-16 | Discharge: 2024-07-16 | Disposition: A | Discharge status: Home / Self Care | Source: Ambulatory Visit | Attending: Radiation Oncology | Admitting: Radiation Oncology

## 2024-07-16 ENCOUNTER — Ambulatory Visit

## 2024-07-16 ENCOUNTER — Other Ambulatory Visit: Payer: Self-pay

## 2024-07-16 DIAGNOSIS — Z170 Estrogen receptor positive status [ER+]: Secondary | ICD-10-CM | POA: Diagnosis not present

## 2024-07-16 DIAGNOSIS — Z510 Encounter for antineoplastic radiation therapy: Secondary | ICD-10-CM | POA: Diagnosis not present

## 2024-07-16 DIAGNOSIS — D0512 Intraductal carcinoma in situ of left breast: Secondary | ICD-10-CM | POA: Insufficient documentation

## 2024-07-16 LAB — RAD ONC ARIA SESSION SUMMARY
Course Elapsed Days: 18
Plan Fractions Treated to Date: 15
Plan Prescribed Dose Per Fraction: 2.67 Gy
Plan Total Fractions Prescribed: 16
Plan Total Prescribed Dose: 42.72 Gy
Reference Point Dosage Given to Date: 40.05 Gy
Reference Point Session Dosage Given: 2.67 Gy
Session Number: 15

## 2024-07-19 ENCOUNTER — Ambulatory Visit

## 2024-07-19 ENCOUNTER — Ambulatory Visit: Admission: RE | Admit: 2024-07-19 | Source: Ambulatory Visit

## 2024-07-19 ENCOUNTER — Inpatient Hospital Stay: Attending: Hematology | Admitting: Hematology

## 2024-07-19 VITALS — BP 128/68 | HR 72 | Temp 98.0°F | Resp 15 | Ht 67.0 in | Wt 145.5 lb

## 2024-07-19 DIAGNOSIS — D0512 Intraductal carcinoma in situ of left breast: Secondary | ICD-10-CM | POA: Insufficient documentation

## 2024-07-19 DIAGNOSIS — Z79899 Other long term (current) drug therapy: Secondary | ICD-10-CM | POA: Insufficient documentation

## 2024-07-19 DIAGNOSIS — Z923 Personal history of irradiation: Secondary | ICD-10-CM | POA: Insufficient documentation

## 2024-07-19 DIAGNOSIS — M8580 Other specified disorders of bone density and structure, unspecified site: Secondary | ICD-10-CM | POA: Diagnosis not present

## 2024-07-19 NOTE — Assessment & Plan Note (Signed)
 High grade DCIS, ER/PR+ - Diagnosed in May 2025.  Discovered by screening mammogram -Patient underwent left breast lumpectomy in June 2025, pathology confirmed high-grade DCIS, margins were negative. -Patient had adjuvant radiation

## 2024-07-19 NOTE — Progress Notes (Signed)
 [name]   Telephone:(336) [phone] Fax:(336) [phone]   Clinic Follow up Note   Patient Care Team: Jason Leita Repine, FNP as PCP - General (Internal Medicine) Glean Stephane BROCKS, RN (Inactive) as Oncology Nurse Navigator Tyree Nanetta SAILOR, RN as Oncology Nurse Navigator Lanny Callander, MD as Consulting Physician (Hematology) Curvin Deward MOULD, MD as Consulting Physician (General Surgery) Shannon Agent, MD as Consulting Physician (Radiation Oncology)  Date of Service:  07/19/2024  CHIEF COMPLAINT: f/u of left breast DCIS  CURRENT THERAPY:  Surveillance  Oncology History   Ductal carcinoma in situ (DCIS) of left breast High grade DCIS, ER/PR+ - Diagnosed in May 2025.  Discovered by screening mammogram -Patient underwent left breast lumpectomy in June 2025, pathology confirmed high-grade DCIS, margins were negative. -Patient had adjuvant radiation  Assessment & Plan Ductal carcinoma in situ of breast, status post surgery and radiation Completed radiation therapy for DCIS of the breast, with the last session today. Surgery on June 2nd showed no invasive cancer. DCIS is estrogen receptor-positive (PR positive) at 100%. Radiation has reduced recurrence risk by approximately 60%. Tamoxifen was discussed as a preventive measure against future breast cancer. Concerns about joint pain and mood swings were expressed, which she experienced after discontinuing estrogen therapy. Tamoxifen is less likely to cause joint pain compared to other anti-estrogen therapies and may benefit osteopenia. It carries a 4-5% risk of blood clots at full dose, but risk of thrombosis is similar to the general population at the low dose considered for DCIS. Mood swings and frustration may be related to low estrogen levels. After discussing risks and benefits, the small benefit from tamoxifen may not justify potential side effects. - We made mutual decision not to pursue adjuvant tamoxifen therapy. - Perform  annual mammogram. - Consider breast MRI annually if insurance covers it, alternating with mammogram every six months. - Avoid estrogen therapy.  Osteopenia and osteoarthritis Osteopenia and osteoarthritis with joint pain, previously managed with estrogen therapy. Tamoxifen was discussed as a potential therapy that could benefit bone density, as it does not cause osteopenia or osteoporosis like other anti-estrogen therapies. Active lifestyle includes walking her dog four times a day, but does not currently engage in weight-bearing exercises, which could benefit her condition. - Engage in weight-bearing exercises to improve bone density. - Monitor joint pain and consider non-estrogenic therapies if needed.  Plan - She has completed adjuvant radiation - We discussed additional Samora benefit of adjuvant tamoxifen, and potential side effects.  Patient is experiencing menopausal symptoms again since she stopped the estrogen supplement, and we decided not to pursue tamoxifen. - She will continue annual screening mammogram, and to consider screening breast MRI. - I will see her as needed   SUMMARY OF ONCOLOGIC HISTORY: Oncology History  Ductal carcinoma in situ (DCIS) of left breast  04/16/2024 Cancer Staging   Staging form: Breast, AJCC 8th Edition - Clinical stage from 04/16/2024: Stage 0 (cTis (DCIS), cN0, cM0, G2, ER+, PR+, HER2: Not Assessed) - Signed by Lanny Callander, MD on 04/28/2024 Histologic grading system: 3 grade system   04/19/2024 Initial Diagnosis   Ductal carcinoma in situ (DCIS) of left breast   05/17/2024 Cancer Staging   Staging form: Breast, AJCC 8th Edition - Pathologic stage from 05/17/2024: Stage Unknown (pTis (DCIS), pNX, cM0, G3, ER+, PR+, HER2: Not Assessed) - Signed by Lanny Callander, MD on 07/19/2024 Stage prefix: Initial diagnosis Histologic grading system: 3 grade system Residual tumor (R): R0      Discussed the use  of AI scribe software for clinical note transcription with the  patient, who gave verbal consent to proceed.  History of Present Illness Allison Gilbert is a 68 year old female with breast ductal carcinoma in situ (DCIS) who presents for follow-up after radiation therapy.  She is completing radiation therapy for DCIS today, experiencing minimal blistering as a side effect. Surgery on May 17, 2024, confirmed DCIS without invasive cancer. Her breast cancer is PR positive at 100%.  She previously used estrogen therapy for joint aches during menopause, affecting her femur, hip, thumbs, and wrists. Discontinuation of estrogen has led to a recurrence of these symptoms.  She has osteopenia and osteoarthritis. Her current medications include Celexa  and calcium supplements. She maintains regular physical activity by walking her dog four times a day.     All other systems were reviewed with the patient and are negative.  MEDICAL HISTORY:  Past Medical History:  Diagnosis Date   Anxiety    Arthritis    Cancer (HCC)    Complication of anesthesia    woke up during surgery   Depression     SURGICAL HISTORY: Past Surgical History:  Procedure Laterality Date   ABDOMINAL HYSTERECTOMY     TAH BSO-Due to abnormal pap   BREAST BIOPSY Left 04/16/2024   MM LT BREAST BX W LOC DEV 1ST LESION IMAGE BX SPEC STEREO GUIDE 04/16/2024 [facility]   BREAST BIOPSY  05/14/2024   MM LT RADIOACTIVE SEED LOC MAMMO GUIDE 05/14/2024 [facility]   BREAST LUMPECTOMY WITH RADIOACTIVE SEED LOCALIZATION Left 05/17/2024   Procedure: BREAST LUMPECTOMY WITH RADIOACTIVE SEED LOCALIZATION;  Surgeon: Curvin Deward MOULD, MD;  Location:  SURGERY CENTER;  Service: General;  Laterality: Left;  LEFT BREAST RADIOACTIVE SEED LOCALIZED LUMPECTOMY   cage and plates in neck     plate in heel      I have reviewed the social history and family history with the patient and they are unchanged from previous note.  ALLERGIES:  is allergic to poison ivy extract and  codeine.  MEDICATIONS:  Current Outpatient Medications  Medication Sig Dispense Refill   Calcium Carbonate-Vitamin D  (CALCIUM 500 + D) 500-125 MG-UNIT TABS Take by mouth.     citalopram  (CELEXA ) 10 MG tablet Take 1 tablet (10 mg total) by mouth daily. 90 tablet 3   LORazepam  (ATIVAN ) 1 MG tablet Take 1 tablet (1 mg total) by mouth at bedtime as needed for anxiety. 30 tablet 1   VITAMIN D  PO Take 2,000 Units by mouth.     No current facility-administered medications for this visit.    PHYSICAL EXAMINATION: ECOG PERFORMANCE STATUS: 0 - Asymptomatic  Vitals:   07/19/24 1015  BP: 128/68  Pulse: 72  Resp: 15  Temp: 98 F (36.7 C)  SpO2: 99%   Wt Readings from Last 3 Encounters:  07/19/24 145 lb 8 oz (66 kg)  06/14/24 141 lb 12.8 oz (64.3 kg)  05/17/24 141 lb 8.6 oz (64.2 kg)     GENERAL:alert, no distress and comfortable SKIN: skin color, texture, turgor are normal, no rashes or significant lesions EYES: normal, Conjunctiva are pink and non-injected, sclera clear Musculoskeletal:no cyanosis of digits and no clubbing  NEURO: alert & oriented x 3 with fluent speech, no focal motor/sensory deficits  Physical Exam    LABORATORY DATA:  I have reviewed the data as listed    Latest Ref Rng & Units 04/28/2024   12:16 PM 08/19/2023    3:09 PM 04/28/2023  9:51 AM  CBC  WBC 4.0 - 10.5 K/uL 5.7  6.1  3.9   Hemoglobin 12.0 - 15.0 g/dL 87.2  87.0  86.6   Hematocrit 36.0 - 46.0 % 38.7  39.9  39.6   Platelets 150 - 400 K/uL 177  181.0  167.0         Latest Ref Rng & Units 04/28/2024   12:16 PM 08/19/2023    3:09 PM 04/28/2023    9:51 AM  CMP  Glucose 70 - 99 mg/dL 82  92  80   BUN 8 - 23 mg/dL 16  15  13    Creatinine 0.44 - 1.00 mg/dL 9.33  9.34  9.35   Sodium 135 - 145 mmol/L 140  140  138   Potassium 3.5 - 5.1 mmol/L 4.2  4.0  4.1   Chloride 98 - 111 mmol/L 103  99  101   CO2 22 - 32 mmol/L 33  34  30   Calcium 8.9 - 10.3 mg/dL 9.2  9.5  9.2   Total Protein 6.5 - 8.1 g/dL  6.6  6.5  6.5   Total Bilirubin 0.0 - 1.2 mg/dL 1.0  0.9  1.6   Alkaline Phos 38 - 126 U/L 48  51  40   AST 15 - 41 U/L 18  18  16    ALT 0 - 44 U/L 14  13  11        RADIOGRAPHIC STUDIES: I have personally reviewed the radiological images as listed and agreed with the findings in the report. No results found.    No orders of the defined types were placed in this encounter.  All questions were answered. The patient knows to call the clinic with any problems, questions or concerns. No barriers to learning was detected. The total time spent in the appointment was 30 minutes, including review of chart and various tests results, discussions about plan of care and coordination of care plan     Onita Mattock, MD 07/19/2024

## 2024-07-20 ENCOUNTER — Ambulatory Visit

## 2024-07-21 ENCOUNTER — Ambulatory Visit

## 2024-07-22 ENCOUNTER — Ambulatory Visit

## 2024-07-23 ENCOUNTER — Ambulatory Visit
Admission: RE | Admit: 2024-07-23 | Discharge: 2024-07-23 | Disposition: A | Discharge status: Home / Self Care | Source: Ambulatory Visit | Attending: Radiation Oncology | Admitting: Radiation Oncology

## 2024-07-23 ENCOUNTER — Other Ambulatory Visit: Payer: Self-pay

## 2024-07-23 ENCOUNTER — Ambulatory Visit

## 2024-07-23 DIAGNOSIS — D0512 Intraductal carcinoma in situ of left breast: Secondary | ICD-10-CM | POA: Diagnosis not present

## 2024-07-23 DIAGNOSIS — Z170 Estrogen receptor positive status [ER+]: Secondary | ICD-10-CM | POA: Diagnosis not present

## 2024-07-23 DIAGNOSIS — Z510 Encounter for antineoplastic radiation therapy: Secondary | ICD-10-CM | POA: Diagnosis not present

## 2024-07-23 LAB — RAD ONC ARIA SESSION SUMMARY
Course Elapsed Days: 25
Plan Fractions Treated to Date: 16
Plan Prescribed Dose Per Fraction: 2.67 Gy
Plan Total Fractions Prescribed: 16
Plan Total Prescribed Dose: 42.72 Gy
Reference Point Dosage Given to Date: 42.72 Gy
Reference Point Session Dosage Given: 2.67 Gy
Session Number: 16

## 2024-07-26 ENCOUNTER — Telehealth: Payer: Self-pay

## 2024-07-26 ENCOUNTER — Ambulatory Visit

## 2024-07-26 NOTE — Radiation Completion Notes (Addendum)
  Radiation Oncology         (336) [phone] ________________________________  Name: Allison Gilbert MRN: [id number]  Date of Service: 07/23/2024  DOB: 05/19/1956  End of Treatment Note  Diagnosis:  Stage 0 (cTis (DCIS), cN0, cM0) Left Breast LIQ, High-grade DCIS, ER+ / PR+  Intent: Curative     ==========DELIVERED PLANS==========  First Treatment Date: 2024-06-28 Last Treatment Date: 2024-07-23   Plan Name: Breast_L_BH Site: Breast, Left Technique: 3D Mode: Photon Dose Per Fraction: 2.67 Gy Prescribed Dose (Delivered / Prescribed): 42.72 Gy / 42.72 Gy Prescribed Fxs (Delivered / Prescribed): 16 / 16     ====================================   The patient tolerated radiation. She developed anticipated skin changes in the treatment field and some localized breast pain.  The patient will return in one month and will continue follow up with Dr. Lanny as well.      Ronita Due, PA-C

## 2024-07-26 NOTE — Progress Notes (Signed)
 Allison Gilbert is here today for follow up post radiation to the breast.   Breast Side:Left Breast   They completed their radiation on: 07/23/24   Does the patient complain of any of the following: Post radiation skin issues:  Yes, red, itchy and blisters.  Breast Tenderness: Yes  Breast Swelling: No Lymphadema: No Range of Motion limitations: No Fatigue post radiation: No Appetite good/fair/poor: good   Additional comments if applicable:  BP 114/68 (BP Location: Left Arm, Patient Position: Sitting)   Pulse 80   Temp 97.7 F (36.5 C) (Temporal)   Resp 18   Ht 5' 7 (1.702 m)   Wt 146 lb (66.2 kg)   SpO2 100%   BMI 22.87 kg/m

## 2024-07-26 NOTE — Telephone Encounter (Signed)
 Allison Gilbert left a voicemail stating that she felt a small fluid filled nodule and wanted to be seen today. Please advise.

## 2024-07-26 NOTE — Progress Notes (Signed)
 Radiation Oncology         (336) [phone] ________________________________  Name: Allison Gilbert MRN: [id number]  Date: 07/27/2024  DOB: [birth year]  Follow-Up Visit Note  CC: Jason Leita Repine, FNP  Jason Leita Repine,*    ICD-10-CM   1. Ductal carcinoma in situ (DCIS) of left breast  D05.12       Diagnosis:  Stage 0 (cTis (DCIS), cN0, cM0) Left Breast LIQ, High-grade DCIS, ER+ / PR+ / Her2 not assessed: s/p left breast lumpectomy   Interval Since Last Radiation:  4 days  Intent: curative  First Treatment Date: 2024-06-28 Last Treatment Date: 2024-07-23   Plan Name: Breast_L_BH Site: Breast, Left Technique: 3D Mode: Photon Dose Per Fraction: 2.67 Gy Prescribed Dose (Delivered / Prescribed): 42.72 Gy / 42.72 Gy Prescribed Fxs (Delivered / Prescribed): 16 / 16   Narrative:  The patient returns today for routine follow-up. She was last seen in office on 06/14/24 for a consultation visit. Since then, patient completed her radiation treatment which she tolerated quite well. Patient did however endorse experiencing skin irritation, a red, itchy rash in the treatment field along with dizziness and pain.   In the interval since she was last seen, she presented for a follow up visit with Dr. Lanny on 07/19/24 during which she discussed further treatment plan. Upon discussion, she opted to not pursue with adjuvant tamoxifen due to experiencing menopausal like symptoms previously when she stopped the estrogen supplement.   No other significant oncologic interval history since the patient was last seen.   On evaluation today the patient reports noting a cystic area in the upper aspect of her breast area.  She denies any drainage from this area or significant pain.  She wished to be seen earlier in light of this issue.                               Allergies:  is allergic to poison ivy extract and codeine.  Meds: Current Outpatient Medications  Medication Sig Dispense Refill    Calcium Carbonate-Vitamin D  (CALCIUM 500 + D) 500-125 MG-UNIT TABS Take by mouth.     citalopram  (CELEXA ) 10 MG tablet Take 1 tablet (10 mg total) by mouth daily. 90 tablet 3   LORazepam  (ATIVAN ) 1 MG tablet Take 1 tablet (1 mg total) by mouth at bedtime as needed for anxiety. 30 tablet 1   VITAMIN D  PO Take 2,000 Units by mouth.     No current facility-administered medications for this encounter.    Physical Findings: The patient is in no acute distress. Patient is alert and oriented.  height is 5' 7 (1.702 m) and weight is 146 lb (66.2 kg). Her temporal temperature is 97.7 F (36.5 C). Her blood pressure is 114/68 and her pulse is 80. Her respiration is 18 and oxygen saturation is 100%. .  No significant changes. Lungs are clear to auscultation bilaterally. Heart has regular rate and rhythm. No palpable cervical, supraclavicular, or axillary adenopathy. Abdomen soft, non-tender, normal bowel sounds. 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  There is some swelling noticed in this area but no dominant mass appreciated or fluid-filled sac.  She has prominent ribs in this area.  No nipple discharge or bleeding.  Lab Findings: Lab Results  Component Value Date   WBC 5.7 04/28/2024   HGB 12.7 04/28/2024   HCT 38.7 04/28/2024   MCV 95.3 04/28/2024  PLT 177 04/28/2024    Radiographic Findings: No results found.  Impression: Stage 0 (cTis (DCIS), cN0, cM0) Left Breast LIQ, High-grade DCIS, ER+ / PR+ / Her2 not assessed: s/p left breast lumpectomy   The patient is recovering from the effects of radiation.  No evidence of recurrence on clinical exam today.  Reassured the patient that her skin and breast changes are normal at this point at a few days after her radiation treatment.  Plan: She will return for follow-up in approximately 6 weeks.   ____________________________________  Lynwood CHARM Nasuti, PhD, MD  This document serves as a record  of services personally performed by Lynwood Nasuti, MD. It was created on his behalf by Reymundo Cartwright, a trained medical scribe. The creation of this record is based on the scribe's personal observations and the provider's statements to them. This document has been checked and approved by the attending provider.

## 2024-07-27 ENCOUNTER — Ambulatory Visit
Admission: RE | Admit: 2024-07-27 | Discharge: 2024-07-27 | Disposition: A | Discharge status: Home / Self Care | Source: Ambulatory Visit | Attending: Radiation Oncology | Admitting: Radiation Oncology

## 2024-07-27 ENCOUNTER — Encounter: Payer: Self-pay | Admitting: Radiation Oncology

## 2024-07-27 VITALS — BP 114/68 | HR 80 | Temp 97.7°F | Resp 18 | Ht 67.0 in | Wt 146.0 lb

## 2024-07-27 DIAGNOSIS — Z923 Personal history of irradiation: Secondary | ICD-10-CM | POA: Insufficient documentation

## 2024-07-27 DIAGNOSIS — D0512 Intraductal carcinoma in situ of left breast: Secondary | ICD-10-CM | POA: Diagnosis not present

## 2024-07-27 DIAGNOSIS — Z170 Estrogen receptor positive status [ER+]: Secondary | ICD-10-CM | POA: Diagnosis not present

## 2024-07-27 DIAGNOSIS — Z1721 Progesterone receptor positive status: Secondary | ICD-10-CM | POA: Insufficient documentation

## 2024-07-27 DIAGNOSIS — Z79899 Other long term (current) drug therapy: Secondary | ICD-10-CM | POA: Insufficient documentation

## 2024-08-20 ENCOUNTER — Ambulatory Visit: Admitting: Family

## 2024-08-20 ENCOUNTER — Encounter: Payer: Self-pay | Admitting: Family

## 2024-08-20 VITALS — BP 116/76 | HR 69 | Temp 97.6°F | Ht 67.0 in | Wt 144.4 lb

## 2024-08-20 DIAGNOSIS — R5383 Other fatigue: Secondary | ICD-10-CM | POA: Diagnosis not present

## 2024-08-20 DIAGNOSIS — Z131 Encounter for screening for diabetes mellitus: Secondary | ICD-10-CM

## 2024-08-20 DIAGNOSIS — Z13220 Encounter for screening for lipoid disorders: Secondary | ICD-10-CM | POA: Diagnosis not present

## 2024-08-20 DIAGNOSIS — Z0001 Encounter for general adult medical examination with abnormal findings: Secondary | ICD-10-CM

## 2024-08-20 DIAGNOSIS — Z0000 Encounter for general adult medical examination without abnormal findings: Secondary | ICD-10-CM

## 2024-08-20 LAB — CBC WITH DIFFERENTIAL/PLATELET
Basophils Absolute: 0 K/uL (ref 0.0–0.1)
Basophils Relative: 0.6 % (ref 0.0–3.0)
Eosinophils Absolute: 0.2 K/uL (ref 0.0–0.7)
Eosinophils Relative: 4.5 % (ref 0.0–5.0)
HCT: 40.6 % (ref 36.0–46.0)
Hemoglobin: 13.4 g/dL (ref 12.0–15.0)
Lymphocytes Relative: 32.1 % (ref 12.0–46.0)
Lymphs Abs: 1.2 K/uL (ref 0.7–4.0)
MCHC: 32.9 g/dL (ref 30.0–36.0)
MCV: 96.3 fl (ref 78.0–100.0)
Monocytes Absolute: 0.3 K/uL (ref 0.1–1.0)
Monocytes Relative: 6.7 % (ref 3.0–12.0)
Neutro Abs: 2.1 K/uL (ref 1.4–7.7)
Neutrophils Relative %: 56.1 % (ref 43.0–77.0)
Platelets: 151 K/uL (ref 150.0–400.0)
RBC: 4.22 Mil/uL (ref 3.87–5.11)
RDW: 13.5 % (ref 11.5–15.5)
WBC: 3.8 K/uL — ABNORMAL LOW (ref 4.0–10.5)

## 2024-08-20 LAB — LIPID PANEL
Cholesterol: 221 mg/dL — ABNORMAL HIGH (ref 0–200)
HDL: 101.3 mg/dL (ref >39.00)
LDL Cholesterol: 108 mg/dL — ABNORMAL HIGH (ref 0–99)
NonHDL: 119.54
Total CHOL/HDL Ratio: 2
Triglycerides: 59 mg/dL (ref 0.0–149.0)
VLDL: 11.8 mg/dL (ref 0.0–40.0)

## 2024-08-20 LAB — COMPREHENSIVE METABOLIC PANEL WITH GFR
ALT: 12 U/L (ref 0–35)
AST: 19 U/L (ref 0–37)
Albumin: 4.4 g/dL (ref 3.5–5.2)
Alkaline Phosphatase: 44 U/L (ref 39–117)
BUN: 12 mg/dL (ref 6–23)
CO2: 30 meq/L (ref 19–32)
Calcium: 9.4 mg/dL (ref 8.4–10.5)
Chloride: 100 meq/L (ref 96–112)
Creatinine, Ser: 0.65 mg/dL (ref 0.40–1.20)
GFR: 89.95 mL/min (ref >60.00)
Glucose, Bld: 125 mg/dL — ABNORMAL HIGH (ref 70–99)
Potassium: 4.7 meq/L (ref 3.5–5.1)
Sodium: 139 meq/L (ref 135–145)
Total Bilirubin: 1.8 mg/dL — ABNORMAL HIGH (ref 0.2–1.2)
Total Protein: 6.7 g/dL (ref 6.0–8.3)

## 2024-08-20 LAB — HEMOGLOBIN A1C: Hgb A1c MFr Bld: 5.9 % (ref 4.6–6.5)

## 2024-08-20 LAB — TSH: TSH: 2.83 u[IU]/mL (ref 0.35–5.50)

## 2024-08-20 MED ORDER — HYDROXYZINE HCL 10 MG PO TABS: 10.0000 mg | ORAL_TABLET | Freq: Three times a day (TID) | ORAL | 0 refills | Status: AC | PRN

## 2024-08-20 MED ORDER — CITALOPRAM HYDROBROMIDE 20 MG PO TABS: 20.0000 mg | ORAL_TABLET | Freq: Every day | ORAL | 1 refills | Status: AC

## 2024-08-20 NOTE — Progress Notes (Signed)
 Allison Gilbert is a 68 y.o. female with the following history as recorded in [employer]:  Patient Active Problem List   Diagnosis Date Noted   Ductal carcinoma in situ (DCIS) of left breast 04/19/2024   Allergic contact dermatitis due to plants, except food 08/22/2022   H/O cervical spine surgery 11/18/2018   S/P total hysterectomy and bilateral salpingo-oophorectomy 11/18/2018   GERD (gastroesophageal reflux disease) 10/23/2017   Adjustment disorder with mixed anxiety and depressed mood 09/25/2014   Insomnia 09/25/2014    Current Outpatient Medications  Medication Sig Dispense Refill   Calcium Carbonate-Vitamin D  (CALCIUM 500 + D) 500-125 MG-UNIT TABS Take by mouth.     hydrOXYzine  (ATARAX ) 10 MG tablet Take 1 tablet (10 mg total) by mouth 3 (three) times daily as needed for itching. 60 tablet 0   LORazepam  (ATIVAN ) 1 MG tablet Take 1 tablet (1 mg total) by mouth at bedtime as needed for anxiety. 30 tablet 1   VITAMIN D  PO Take 2,000 Units by mouth.     citalopram  (CELEXA ) 20 MG tablet Take 1 tablet (20 mg total) by mouth daily. 90 tablet 1   No current facility-administered medications for this visit.    Allergies: Poison ivy extract and Codeine  Past Medical History:  Diagnosis Date   Anxiety    Arthritis    Cancer (HCC)    Complication of anesthesia    woke up during surgery   Depression     Past Surgical History:  Procedure Laterality Date   ABDOMINAL HYSTERECTOMY     TAH BSO-Due to abnormal pap   BREAST BIOPSY Left 04/16/2024   MM LT BREAST BX W LOC DEV 1ST LESION IMAGE BX SPEC STEREO GUIDE 04/16/2024 [facility]   BREAST BIOPSY  05/14/2024   MM LT RADIOACTIVE SEED LOC MAMMO GUIDE 05/14/2024 [facility]   BREAST LUMPECTOMY WITH RADIOACTIVE SEED LOCALIZATION Left 05/17/2024   Procedure: BREAST LUMPECTOMY WITH RADIOACTIVE SEED LOCALIZATION;  Surgeon: Curvin Deward MOULD, MD;  Location: [facility];  Service: General;  Laterality: Left;  LEFT BREAST  RADIOACTIVE SEED LOCALIZED LUMPECTOMY   cage and plates in neck     plate in heel      Family History  Problem Relation Age of Onset   Stroke Maternal Grandmother     Social History   Tobacco Use   Smoking status: Never   Smokeless tobacco: Never  Substance Use Topics   Alcohol use: Yes    Alcohol/week: 3.0 standard drinks of alcohol    Types: 3 Glasses of wine per week    Comment: occais    Subjective:  Presents for yearly CPE; working with oncology for management of DCIS; would like to increase Celexa  dosage since having to stop estrogen;  Continuing to struggle with chronic itching of left arm- did see dermatology last year but no obvious source noted;  Increase fatigue/ not sleeping well after completing radiation;  Review of Systems  Constitutional: Negative.   HENT: Negative.    Eyes: Negative.   Respiratory: Negative.    Cardiovascular: Negative.   Gastrointestinal: Negative.   Genitourinary: Negative.   Musculoskeletal: Negative.   Skin:  Positive for itching.  Neurological: Negative.   Endo/Heme/Allergies: Negative.   Psychiatric/Behavioral: Negative.       Objective:  Vitals:   08/20/24 1019  BP: 116/76  Pulse: 69  Temp: 97.6 F (36.4 C)  TempSrc: Oral  SpO2: 99%  Weight: 144 lb 6.4 oz (65.5 kg)  Height: 5' 7 (1.702  m)    General: Well developed, well nourished, in no acute distress  Skin : Warm and dry.  Head: Normocephalic and atraumatic  Eyes: Sclera and conjunctiva clear; pupils round and reactive to light; extraocular movements intact  Ears: External normal; canals clear; tympanic membranes normal  Oropharynx: Pink, supple. No suspicious lesions  Neck: Supple without thyromegaly, adenopathy  Lungs: Respirations unlabored; clear to auscultation bilaterally without wheeze, rales, rhonchi  CVS exam: normal rate and regular rhythm.  Abdomen: Soft; nontender; nondistended; normoactive bowel sounds; no masses or hepatosplenomegaly   Musculoskeletal: No deformities; no active joint inflammation  Extremities: No edema, cyanosis, clubbing  Vessels: Symmetric bilaterally  Neurologic: Alert and oriented; speech intact; face symmetrical; moves all extremities well; CNII-XII intact without focal deficit   Assessment:  1. PE (physical exam), annual   2. Lipid screening   3. Other fatigue     Plan:  Age appropriate preventive healthcare needs addressed; encouraged regular eye doctor and dental exams; encouraged regular exercise; will update labs and refills as needed today; follow-up to be determined; Increase Citalopram  to 20 mg daily; trial of Hydroxyzine  to help with insomnia and itching; if itching persists, to consider referral to dermatology; She will plan to update COVID and flu and shingles vaccines at later dates.   No follow-ups on file.  Orders Placed This Encounter  Procedures   CBC with Differential/Platelet   Comp Met (CMET)   Lipid panel   TSH   Hemoglobin A1c    Requested Prescriptions   Signed Prescriptions Disp Refills   hydrOXYzine  (ATARAX ) 10 MG tablet 60 tablet 0    Sig: Take 1 tablet (10 mg total) by mouth 3 (three) times daily as needed for itching.   citalopram  (CELEXA ) 20 MG tablet 90 tablet 1    Sig: Take 1 tablet (20 mg total) by mouth daily.

## 2024-08-23 ENCOUNTER — Ambulatory Visit: Payer: Self-pay | Admitting: Family

## 2024-08-30 ENCOUNTER — Encounter: Payer: Self-pay | Admitting: Family

## 2024-08-30 NOTE — Telephone Encounter (Signed)
 Requesting: lorazepam  1mg   Contract: None UDS: None Last Visit: 08/20/24 Next Visit: None Last Refill: 07/19/22 #30 and 1RF   Please Advise

## 2024-08-31 MED ORDER — LORAZEPAM 1 MG PO TABS: 1.0000 mg | ORAL_TABLET | Freq: Every evening | ORAL | 0 refills | Status: AC | PRN

## 2024-09-07 ENCOUNTER — Encounter: Payer: Self-pay | Admitting: Radiation Oncology

## 2024-09-08 NOTE — Progress Notes (Signed)
  Radiation Oncology         [phone] ________________________________  Name: Allison Gilbert MRN: [id number]  Date: 09/09/2024  DOB: 1956/11/30  Follow-Up Visit Note  CC: Jason Leita Repine, FNP  Jason Leita Repine,*  No diagnosis found.  Diagnosis: Stage 0 (cTis (DCIS), cN0, cM0) Left Breast LIQ, High-grade DCIS, ER+ / PR+ / Her2 not assessed: s/p left breast lumpectomy    Interval Since Last Radiation:  1 month 18 days  Intent: curative  First Treatment Date: 2024-06-28 Last Treatment Date: 2024-07-23   Plan Name: Breast_L_BH Site: Breast, Left Technique: 3D Mode: Photon Dose Per Fraction: 2.67 Gy Prescribed Dose (Delivered / Prescribed): 42.72 Gy / 42.72 Gy Prescribed Fxs (Delivered / Prescribed): 16 / 16   Narrative:  The patient returns today for routine follow-up. She was last seen in office on 07/27/24 for a follow up visit.  Patient continued to follow up with their specialists to manage their chronic conditions.   No other significant oncologic interval history since the patient was last seen.                            Allergies:  is allergic to poison ivy extract and codeine.  Meds: Current Outpatient Medications  Medication Sig Dispense Refill   Calcium Carbonate-Vitamin D  (CALCIUM 500 + D) 500-125 MG-UNIT TABS Take by mouth.     citalopram  (CELEXA ) 20 MG tablet Take 1 tablet (20 mg total) by mouth daily. 90 tablet 1   hydrOXYzine  (ATARAX ) 10 MG tablet Take 1 tablet (10 mg total) by mouth 3 (three) times daily as needed for itching. 60 tablet 0   LORazepam  (ATIVAN ) 1 MG tablet Take 1 tablet (1 mg total) by mouth at bedtime as needed for anxiety. 30 tablet 0   VITAMIN D  PO Take 2,000 Units by mouth.     No current facility-administered medications for this visit.    Physical Findings: The patient is in no acute distress. Patient is alert and oriented.  vitals were not taken for this visit. .  No significant changes. Lungs are clear to  auscultation bilaterally. Heart has regular rate and rhythm. No palpable cervical, supraclavicular, or axillary adenopathy. Abdomen soft, non-tender, normal bowel sounds.   Lab Findings: Lab Results  Component Value Date   WBC 3.8 (L) 08/20/2024   HGB 13.4 08/20/2024   HCT 40.6 08/20/2024   MCV 96.3 08/20/2024   PLT 151.0 08/20/2024    Radiographic Findings: No results found.  Impression: Stage 0 (cTis (DCIS), cN0, cM0) Left Breast LIQ, High-grade DCIS, ER+ / PR+ / Her2 not assessed: s/p left breast lumpectomy     The patient is recovering from the effects of radiation.  ***  Plan:  ***   *** minutes of total time was spent for this patient encounter, including preparation, face-to-face counseling with the patient and coordination of care, physical exam, and documentation of the encounter. ____________________________________  Lynwood CHARM Nasuti, PhD, MD  This document serves as a record of services personally performed by Lynwood Nasuti, MD. It was created on his behalf by Reymundo Cartwright, a trained medical scribe. The creation of this record is based on the scribe's personal observations and the provider's statements to them. This document has been checked and approved by the attending provider.

## 2024-09-09 ENCOUNTER — Ambulatory Visit
Admission: RE | Admit: 2024-09-09 | Discharge: 2024-09-09 | Disposition: A | Discharge status: Home / Self Care | Source: Ambulatory Visit | Attending: Radiation Oncology | Admitting: Radiation Oncology

## 2024-09-09 ENCOUNTER — Encounter: Payer: Self-pay | Admitting: Radiation Oncology

## 2024-09-09 VITALS — BP 109/81 | HR 75 | Temp 97.7°F | Resp 18 | Ht 67.0 in | Wt 139.8 lb

## 2024-09-09 DIAGNOSIS — Z79899 Other long term (current) drug therapy: Secondary | ICD-10-CM | POA: Insufficient documentation

## 2024-09-09 DIAGNOSIS — Z170 Estrogen receptor positive status [ER+]: Secondary | ICD-10-CM | POA: Insufficient documentation

## 2024-09-09 DIAGNOSIS — D0512 Intraductal carcinoma in situ of left breast: Secondary | ICD-10-CM | POA: Insufficient documentation

## 2024-09-09 DIAGNOSIS — Z923 Personal history of irradiation: Secondary | ICD-10-CM | POA: Diagnosis not present

## 2024-09-09 DIAGNOSIS — R42 Dizziness and giddiness: Secondary | ICD-10-CM | POA: Insufficient documentation

## 2024-09-09 DIAGNOSIS — Z1721 Progesterone receptor positive status: Secondary | ICD-10-CM | POA: Diagnosis not present

## 2024-09-09 HISTORY — DX: Personal history of irradiation: Z92.3

## 2024-09-09 NOTE — Progress Notes (Signed)
 Allison Gilbert is here today for follow up post radiation to the breast.   Breast Side:Left Breast   They completed their radiation on: 07/23/24  Does the patient complain of any of the following: Post radiation skin issues: She reports healing well but reports black spot underneath breat. Breast Tenderness: Denies Breast Swelling: Denies Lymphadema: Denies Range of Motion limitations: Denies Fatigue post radiation: Moderate Appetite good/fair/poor: Good  Additional comments if applicable: She states that her memory has gotten worse along with some balance issues she has experienced lately.   BP 109/81 (BP Location: Right Arm, Patient Position: Sitting)   Pulse 75   Temp 97.7 F (36.5 C)   Resp 18   Ht 5' 7 (1.702 m)   Wt 139 lb 12.8 oz (63.4 kg)   SpO2 99%   BMI 21.90 kg/m

## 2024-10-22 ENCOUNTER — Encounter: Payer: Self-pay | Admitting: General Practice

## 2024-10-22 NOTE — Progress Notes (Signed)
 SPIRITUAL CARE AND COUNSELING CONSULT NOTE   VISIT SUMMARY Received call from [facility] for caregiver support regarding her husband's continued decline.   SPIRITUAL ENCOUNTER                                                                                                                                                                      Type of Visit: Follow up Care provided to:: Patient Referral source: Patient request Reason for visit: Urgent spiritual support   SPIRITUAL FRAMEWORK  Presenting Themes: Community and relationships, Impactful experiences and emotions (Caregiver distress) Community/Connection:  (Caregiver for her husband) Strengths: Humor, perspective, reaching out for support Needs/Challenges/Barriers: Caregiving distress Patient Stress Factors: Loss of control, Major life changes, Family relationships   GOALS   Self/Personal Goals: Continue to practice self-care in the midst of caring for husband Clinical Care Goals: Provide availability for follow-up support   INTERVENTIONS   Spiritual Care Interventions Made: Compassionate presence, Reflective listening, Normalization of emotions, Supported grief process (Grief at loss of old normal)    INTERVENTION OUTCOMES   Outcomes: Connection to spiritual care, Connection to values and goals of care, Awareness of support, Reduced isolation  SPIRITUAL CARE PLAN   Spiritual Care Issues Still Outstanding: Chaplain will continue to follow Follow up plan : Patient will phone chaplain to set up follow-up phone appointment    Chaplain Olam Corrigan, MDiv, [facility] Pager [phone] Voicemail [phone]

## 2024-10-27 ENCOUNTER — Encounter: Payer: Self-pay | Admitting: General Practice

## 2024-10-27 NOTE — Progress Notes (Signed)
 SPIRITUAL CARE AND COUNSELING CONSULT NOTE   VISIT SUMMARY Followed up in Spiritual Care office for spiritual/emotional support related to caregiving and other life stressors, as well as brainstorming about constructive responses.   SPIRITUAL ENCOUNTER                                                                                                                                                                      Type of Visit: Follow up Care provided to:: Patient Referral source: Patient request Reason for visit: Routine spiritual support  SPIRITUAL FRAMEWORK  Presenting Themes: Community and relationships, Courage hope and growth, Impactful experiences and emotions Strengths: Reflection, perspective, humor, resilience/inner strength Needs/Challenges/Barriers: Caregiving distress Patient Stress Factors: Loss of control, Major life changes   GOALS   Self/Personal Goals: Continue to practice self-care, including boundary/expectation-setting Clinical Care Goals: Provide availability for follow-up support   INTERVENTIONS   Spiritual Care Interventions Made: Compassionate presence, Reflective listening, Normalization of emotions, Reconciliation with self/others, Narrative/life review, Meaning making   INTERVENTION OUTCOMES   Outcomes: Connection to values and goals of care, Awareness around self/spiritual resourses, Awareness of support, Reduced isolation  SPIRITUAL CARE PLAN   Spiritual Care Issues Still Outstanding: Chaplain will continue to follow Follow up plan : Patient will phone to schedule follow-up appointment    Chaplain Olam Corrigan, MDiv, [facility] Pager [phone] Voicemail [phone]

## 2024-11-01 NOTE — Progress Notes (Unsigned)
 CLINIC:  Survivorship   REASON FOR VISIT:  Routine follow-up post-treatment for a recent history of breast cancer.  BRIEF ONCOLOGIC HISTORY:  Oncology History  Ductal carcinoma in situ (DCIS) of left breast  04/16/2024 Cancer Staging   Staging form: Breast, AJCC 8th Edition - Clinical stage from 04/16/2024: Stage 0 (cTis (DCIS), cN0, cM0, G2, ER+, PR+, HER2: Not Assessed) - Signed by Lanny Callander, MD on 04/28/2024 Histologic grading system: 3 grade system   04/19/2024 Initial Diagnosis   Ductal carcinoma in situ (DCIS) of left breast   05/17/2024 Cancer Staging   Staging form: Breast, AJCC 8th Edition - Pathologic stage from 05/17/2024: Stage Unknown (pTis (DCIS), pNX, cM0, G3, ER+, PR+, HER2: Not Assessed) - Signed by Lanny Callander, MD on 07/19/2024 Stage prefix: Initial diagnosis Histologic grading system: 3 grade system Residual tumor (R): R0   06/28/2024 - 07/23/2024 Radiation Therapy   Intent: curative  First Treatment Date: 2024-06-28 Last Treatment Date: 2024-07-23   Plan Name: Breast_L_BH Site: Breast, Left Technique: 3D Mode: Photon Dose Per Fraction: 2.67 Gy Prescribed Dose (Delivered / Prescribed): 42.72 Gy / 42.72 Gy Prescribed Fxs (Delivered / Prescribed): 16 / 16     11/02/2024 Survivorship   Delivered by Powell Lessen, NP     INTERVAL HISTORY:  Allison Gilbert presents to the Survivorship Clinic today for our initial meeting to review her survivorship care plan detailing her treatment course for breast cancer, as well as monitoring long-term side effects of that treatment, education regarding health maintenance, screening, and overall wellness and health promotion.     Overall, Allison Gilbert reports feeling quite well since completing her radiation therapy approximately 3 months ago.  She states she has noticed some left breast fullness and left breast tenderness in the upper midportion of the breast.  She states this began about 10 days ago.  Unchanged.  It is also affecting the  movement of her left arm.  Feels a pulling sensation if she raises her arm above her head.  States she has compounded this with previous neck surgery with fusion of C4-5 and 6 and left shoulder injury which predates her breast cancer.    REVIEW OF SYSTEMS:  Review of Systems - Oncology Breast: Reports very dense breast tissue.  Difficult to assess for new nodules or masses.  She does have feeling of fullness in the left breast along with some tenderness.  This started approximately 10 days ago.     ONCOLOGY TREATMENT TEAM:  1. Surgeon:  Dr. Curvin at [facility] 2. Medical Oncologist: Dr. Lanny  3. Radiation Oncologist: Dr. Shannon    PAST MEDICAL/SURGICAL HISTORY:  Past Medical History:  Diagnosis Date   Anxiety    Arthritis    Cancer (HCC)    Complication of anesthesia    woke up during surgery   Depression    History of radiation therapy    Left breast-06/28/24-07/23/24- Dr. Lynwood Shannon   Past Surgical History:  Procedure Laterality Date   ABDOMINAL HYSTERECTOMY     TAH BSO-Due to abnormal pap   BREAST BIOPSY Left 04/16/2024   MM LT BREAST BX W LOC DEV 1ST LESION IMAGE BX SPEC STEREO GUIDE 04/16/2024 [facility]   BREAST BIOPSY  05/14/2024   MM LT RADIOACTIVE SEED LOC MAMMO GUIDE 05/14/2024 [facility]   BREAST LUMPECTOMY WITH RADIOACTIVE SEED LOCALIZATION Left 05/17/2024   Procedure: BREAST LUMPECTOMY WITH RADIOACTIVE SEED LOCALIZATION;  Surgeon: Curvin Deward MOULD, MD;  Location: [facility];  Service:  General;  Laterality: Left;  LEFT BREAST RADIOACTIVE SEED LOCALIZED LUMPECTOMY   cage and plates in neck     plate in heel       ALLERGIES:  Allergies  Allergen Reactions   Poison Ivy Extract    Codeine Nausea Only and Nausea And Vomiting    Vomiting Many years ago      CURRENT MEDICATIONS:  Outpatient Encounter Medications as of 11/02/2024  Medication Sig   Calcium Carbonate-Vitamin D  (CALCIUM 500 + D) 500-125 MG-UNIT TABS Take  by mouth.   citalopram  (CELEXA ) 20 MG tablet Take 1 tablet (20 mg total) by mouth daily.   hydrOXYzine  (ATARAX ) 10 MG tablet Take 1 tablet (10 mg total) by mouth 3 (three) times daily as needed for itching.   LORazepam  (ATIVAN ) 1 MG tablet Take 1 tablet (1 mg total) by mouth at bedtime as needed for anxiety.   VITAMIN D  PO Take 2,000 Units by mouth.   No facility-administered encounter medications on file as of 11/02/2024.     ONCOLOGIC FAMILY HISTORY:  Family History  Problem Relation Age of Onset   Stroke Maternal Grandmother      SOCIAL HISTORY:  Allison Gilbert is married and lives with her spouse and adult son in [place], [place] .  She has 1 adult child who lives with her and her husband. Allison Gilbert is currently retired.  She denies any current or history of tobacco use.  She does drink socially.  She denies illicit drug use.     PHYSICAL EXAMINATION:  Vital Signs:   Vitals:   11/02/24 1141  BP: 106/68  Resp: 17  Temp: 97.7 F (36.5 C)   Filed Weights   11/02/24 1141  Weight: 145 lb 6.4 oz (66 kg)   General: Well-nourished, well-appearing female in no acute distress.  She is unaccompanied in clinic today.   HEENT: Head is normocephalic.  Pupils equal and reactive to light. Conjunctivae clear without exudate.  Sclerae anicteric. Oral mucosa is pink, moist.  Oropharynx is pink without lesions or erythema.  Lymph: No cervical, supraclavicular, or infraclavicular lymphadenopathy noted on palpation.  Cardiovascular: Regular rate and rhythm.SABRA Respiratory: Clear to auscultation bilaterally. Chest expansion symmetric; breathing non-labored.  GI: Abdomen soft and round; non-tender, non-distended. Bowel sounds normoactive.  GU: Deferred.  Neuro: No focal deficits. Steady gait.  Psych: Mood and affect normal and appropriate for situation.  Extremities: No edema. MSK: No focal spinal tenderness to palpation.  Full range of motion in bilateral upper extremities.   Discomfort noted upon raising the arm over the head.  No abnormalities, swelling, or crepitus can be felt with this motion. Skin: Warm and dry. Breast: Well-healed surgical scar on the left breast and left axillary region.  No swelling or distinct mass is noted on palpation.  Generalized dense tissue can be felt throughout the breast.  There is no nipple inversion or nipple discharge.  Skin dryness noted along the inner upper quadrant of the left breast and stretching into the  left upper chest.  The right breast is without distinct lump or mass.  There is generalized dense tissue throughout the breast.  There is no nipple inversion or nipple discharge.  There is no axillary lymphadenopathy on the right.      ASSESSMENT AND PLAN:  Allison Gilbert is a pleasant 68 y.o. 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 She decided against treatment with anti  estrogen therapy.  She presents to the Survivorship Clinic for our initial meeting and routine follow-up post-completion of treatment for breast cancer.    1. Stage 0 left breast cancer:  Allison Gilbert is continuing to recover from definitive treatment for breast cancer. She will follow-up with her medical oncologist, Dr. Lanny in 01/2025 with history and physical exam per surveillance protocol. She was instructed to make Dr. Lanny or myself aware if she begins to experience any worsening side effects of the medication and I could see her back in clinic to help manage those side effects, as needed. Today, a comprehensive survivorship care plan and treatment summary was reviewed with the patient today detailing her breast cancer diagnosis, treatment course, potential late/long-term effects of treatment, appropriate follow-up care with recommendations for the future, and patient education resources.  A copy of this summary, along with a letter will be sent  to the patient's primary care provider via fax/In Basket message after today's visit.    2. Bone health:  Given Allison Gilbert age/history of breast cancer, she is at risk for bone demineralization.  Her last DEXA scan was 01/09/2023, which showed osteopenia/low bone mass. She has had bilateral oophorectomy, but has elected not to take antiestrogen therapy. She was encouraged to increase her consumption of foods rich in calcium, as well as increase her weight-bearing activities.  She was given education on specific activities to promote bone health.  An order for new DEXA scan for February 2026 will be placed as part of today's visit.  3. Cancer screening:  Due to Allison Gilbert history and her age, she should receive screening for skin cancers, colon cancer, and gynecologic cancers.  The information and recommendations are listed on the patient's comprehensive care plan/treatment summary and were reviewed in detail with the patient.    4. Health maintenance and wellness promotion: Allison Gilbert was encouraged to consume 5-7 servings of fruits and vegetables per day. We reviewed the Nutrition [employer] handout, as well as the handout Take Control of Your Health and Reduce Your Cancer Risk from the [employer].  She was also encouraged to engage in moderate to vigorous exercise for 30 minutes per day most days of the week. We discussed the [employer] fitness program, which is designed for cancer survivors to help them become more physically fit after cancer treatments.  She was instructed to limit her alcohol consumption and continue to abstain from tobacco use.     5. Support services/counseling: It is not uncommon for this period of the patient's cancer care trajectory to be one of many emotions and stressors.  We discussed an opportunity for her to participate in the next session of FYNN (Finding Your New Normal) support group series designed for patients after they have completed treatment.    Allison Gilbert was encouraged to take advantage of our many other support services programs, support groups, and/or counseling in coping with her new life as a cancer survivor after completing anti-cancer treatment.  She was offered support today through active listening and expressive supportive counseling.  She was given information regarding our available services and encouraged to contact me with any questions or for help enrolling in any of our support group/programs.  The patient does experience significant stress at home.  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  She also cares for adult, autistic son.  She does see a therapist regularly.  She is currently on Celexa  daily and has  a prescription for as needed lorazepam .   Dispo:   -Return to cancer center 01/2025  -Mammogram due in 01/2025 -Follow up with surgery as scheduled (11/2024) if. -She is welcome to return back to the Survivorship Clinic at any time; no additional follow-up needed at this time.  -Consider referral back to survivorship as a long-term survivor for continued surveillance  A total of (30) minutes of face-to-face time was spent with this patient with greater than 50% of that time in counseling and care-coordination.   Powell Lessen, NP Survivorship Program [facility] [phone]   Note: PRIMARY CARE PROVIDER Jason Leita Repine, FNP (Inactive)

## 2024-11-02 ENCOUNTER — Encounter: Payer: Self-pay | Admitting: Nurse Practitioner

## 2024-11-02 ENCOUNTER — Inpatient Hospital Stay: Attending: Hematology | Admitting: Nurse Practitioner

## 2024-11-02 VITALS — BP 106/68 | Temp 97.7°F | Resp 17 | Wt 145.4 lb

## 2024-11-02 DIAGNOSIS — E2839 Other primary ovarian failure: Secondary | ICD-10-CM

## 2024-11-02 DIAGNOSIS — D0512 Intraductal carcinoma in situ of left breast: Secondary | ICD-10-CM

## 2024-11-02 NOTE — Assessment & Plan Note (Signed)
 High grade DCIS, ER/PR+ - Diagnosed in May 2025.  Discovered by screening mammogram -Patient underwent left breast lumpectomy in June 2025, pathology confirmed high-grade DCIS, margins were negative. -Patient had adjuvant radiation -Survivorship 11/02/2024

## 2024-11-03 ENCOUNTER — Telehealth: Payer: Self-pay | Admitting: Dietician

## 2024-11-03 NOTE — Telephone Encounter (Signed)
 Contacted pt to schedule an appt. Unable to reach via phone, voicemail was left.

## 2024-11-15 NOTE — Telephone Encounter (Signed)
 Contacted pt to schedule an appt. Unable to reach via phone, voicemail was left.

## 2024-11-22 NOTE — Telephone Encounter (Signed)
 Contacted pt to schedule an appt. Unable to reach via phone, voicemail was left.

## 2024-11-23 ENCOUNTER — Other Ambulatory Visit: Payer: Self-pay | Admitting: Nurse Practitioner

## 2024-11-23 ENCOUNTER — Ambulatory Visit
Admission: RE | Admit: 2024-11-23 | Discharge: 2024-11-23 | Disposition: A | Source: Ambulatory Visit | Attending: Nurse Practitioner

## 2024-11-23 DIAGNOSIS — N6321 Unspecified lump in the left breast, upper outer quadrant: Secondary | ICD-10-CM

## 2024-11-23 DIAGNOSIS — D0512 Intraductal carcinoma in situ of left breast: Secondary | ICD-10-CM

## 2024-11-23 DIAGNOSIS — N6311 Unspecified lump in the right breast, upper outer quadrant: Secondary | ICD-10-CM

## 2024-11-23 HISTORY — DX: Personal history of irradiation: Z92.3

## 2025-02-03 ENCOUNTER — Inpatient Hospital Stay: Attending: Hematology

## 2025-02-03 ENCOUNTER — Inpatient Hospital Stay: Admitting: Nurse Practitioner

## 2025-03-31 ENCOUNTER — Encounter: Admitting: Family Medicine
